# Patient Record
Sex: Male | Born: 1940 | Race: White | Hispanic: No | Smoking: Current every day smoker
Health system: Southern US, Community
[De-identification: ages and names within clinical notes are randomized; demographics above are authoritative.]

## PROBLEM LIST (undated history)

## (undated) DIAGNOSIS — R634 Abnormal weight loss: Secondary | ICD-10-CM

## (undated) DIAGNOSIS — E782 Mixed hyperlipidemia: Secondary | ICD-10-CM

## (undated) DIAGNOSIS — I10 Essential (primary) hypertension: Secondary | ICD-10-CM

## (undated) DIAGNOSIS — G587 Mononeuritis multiplex: Secondary | ICD-10-CM

## (undated) DIAGNOSIS — G609 Hereditary and idiopathic neuropathy, unspecified: Secondary | ICD-10-CM

## (undated) DIAGNOSIS — D509 Iron deficiency anemia, unspecified: Secondary | ICD-10-CM

## (undated) DIAGNOSIS — N289 Disorder of kidney and ureter, unspecified: Secondary | ICD-10-CM

## (undated) DIAGNOSIS — E119 Type 2 diabetes mellitus without complications: Secondary | ICD-10-CM

## (undated) DIAGNOSIS — Z720 Tobacco use: Secondary | ICD-10-CM

## (undated) DIAGNOSIS — R944 Abnormal results of kidney function studies: Secondary | ICD-10-CM

## (undated) HISTORY — DX: Iron deficiency anemia, unspecified: D50.9

## (undated) HISTORY — DX: Mixed hyperlipidemia: E78.2

## (undated) HISTORY — DX: Disorder of kidney and ureter, unspecified: N28.9

## (undated) HISTORY — DX: Abnormal results of kidney function studies: R94.4

## (undated) HISTORY — DX: Tobacco use: Z72.0

## (undated) HISTORY — DX: Hypercalcemia: E83.52

## (undated) HISTORY — DX: Mononeuritis multiplex: G58.7

## (undated) HISTORY — DX: Abnormal weight loss: R63.4

## (undated) HISTORY — DX: Essential (primary) hypertension: I10

## (undated) HISTORY — DX: Type 2 diabetes mellitus without complications: E11.9

## (undated) HISTORY — DX: Hereditary and idiopathic neuropathy, unspecified: G60.9

---

## 1973-08-30 HISTORY — PX: LAPAROSCOPIC APPENDECTOMY: SUR753

## 1999-11-25 ENCOUNTER — Ambulatory Visit (HOSPITAL_BASED_OUTPATIENT_CLINIC_OR_DEPARTMENT_OTHER): Admission: RE | Admit: 1999-11-25 | Discharge: 1999-11-25 | Payer: Self-pay | Admitting: *Deleted

## 2002-09-18 ENCOUNTER — Encounter: Payer: Self-pay | Admitting: *Deleted

## 2002-09-18 ENCOUNTER — Emergency Department (HOSPITAL_COMMUNITY): Admission: EM | Admit: 2002-09-18 | Discharge: 2002-09-18 | Payer: Self-pay | Admitting: *Deleted

## 2002-10-17 ENCOUNTER — Emergency Department (HOSPITAL_COMMUNITY): Admission: EM | Admit: 2002-10-17 | Discharge: 2002-10-17 | Payer: Self-pay | Admitting: Internal Medicine

## 2002-10-17 ENCOUNTER — Encounter: Payer: Self-pay | Admitting: Internal Medicine

## 2002-11-08 ENCOUNTER — Ambulatory Visit (HOSPITAL_COMMUNITY): Admission: RE | Admit: 2002-11-08 | Discharge: 2002-11-08 | Payer: Self-pay | Admitting: Family Medicine

## 2002-11-08 ENCOUNTER — Encounter: Payer: Self-pay | Admitting: Family Medicine

## 2005-10-07 ENCOUNTER — Ambulatory Visit (HOSPITAL_COMMUNITY): Admission: RE | Admit: 2005-10-07 | Discharge: 2005-10-07 | Payer: Self-pay | Admitting: Family Medicine

## 2006-02-16 ENCOUNTER — Ambulatory Visit: Payer: Self-pay | Admitting: Internal Medicine

## 2006-02-17 ENCOUNTER — Encounter (INDEPENDENT_AMBULATORY_CARE_PROVIDER_SITE_OTHER): Payer: Self-pay | Admitting: Internal Medicine

## 2006-02-17 LAB — CONVERTED CEMR LAB
PSA: 1 ng/mL
TSH: 1.265 microintl units/mL

## 2006-03-04 ENCOUNTER — Ambulatory Visit: Payer: Self-pay | Admitting: Internal Medicine

## 2006-03-30 ENCOUNTER — Ambulatory Visit: Payer: Self-pay | Admitting: Internal Medicine

## 2006-06-27 ENCOUNTER — Ambulatory Visit: Payer: Self-pay | Admitting: Internal Medicine

## 2006-06-27 LAB — CONVERTED CEMR LAB: Hgb A1c MFr Bld: 6.7 %

## 2006-07-06 DIAGNOSIS — Z720 Tobacco use: Secondary | ICD-10-CM | POA: Insufficient documentation

## 2006-07-06 DIAGNOSIS — E1149 Type 2 diabetes mellitus with other diabetic neurological complication: Secondary | ICD-10-CM | POA: Insufficient documentation

## 2006-07-06 DIAGNOSIS — E785 Hyperlipidemia, unspecified: Secondary | ICD-10-CM | POA: Insufficient documentation

## 2006-07-06 DIAGNOSIS — F17200 Nicotine dependence, unspecified, uncomplicated: Secondary | ICD-10-CM | POA: Insufficient documentation

## 2006-07-29 ENCOUNTER — Ambulatory Visit: Payer: Self-pay | Admitting: Internal Medicine

## 2006-07-29 LAB — CONVERTED CEMR LAB
Cholesterol: 217 mg/dL
HDL: 33 mg/dL
LDL Cholesterol: 141 mg/dL
Microalb Creat Ratio: 176.4 mg/g
Microalb, Ur: 13.6 mg/dL
Total CHOL/HDL Ratio: 6.6
Triglycerides: 216 mg/dL
VLDL: 43 mg/dL

## 2006-09-16 ENCOUNTER — Ambulatory Visit: Payer: Self-pay | Admitting: Internal Medicine

## 2006-09-16 LAB — CONVERTED CEMR LAB
ALT: 24 units/L (ref 0–53)
AST: 24 units/L (ref 0–37)
Albumin: 4.3 g/dL (ref 3.5–5.2)
Alkaline Phosphatase: 63 units/L (ref 39–117)
BUN: 12 mg/dL (ref 6–23)
CO2: 21 meq/L (ref 19–32)
Calcium: 9.6 mg/dL (ref 8.4–10.5)
Chloride: 104 meq/L (ref 96–112)
Creatinine, Ser: 0.86 mg/dL (ref 0.40–1.50)
Glucose, Bld: 83 mg/dL (ref 70–99)
Potassium: 5.1 meq/L (ref 3.5–5.3)
Sodium: 137 meq/L (ref 135–145)
Total Bilirubin: 0.4 mg/dL (ref 0.3–1.2)
Total Protein: 7.5 g/dL (ref 6.0–8.3)

## 2006-12-15 ENCOUNTER — Encounter (INDEPENDENT_AMBULATORY_CARE_PROVIDER_SITE_OTHER): Payer: Self-pay | Admitting: Internal Medicine

## 2006-12-22 ENCOUNTER — Ambulatory Visit: Payer: Self-pay | Admitting: Internal Medicine

## 2006-12-22 DIAGNOSIS — I10 Essential (primary) hypertension: Secondary | ICD-10-CM | POA: Insufficient documentation

## 2006-12-22 LAB — CONVERTED CEMR LAB
Blood Glucose, Fingerstick: 117
Hgb A1c MFr Bld: 6.1 %

## 2006-12-26 ENCOUNTER — Telehealth (INDEPENDENT_AMBULATORY_CARE_PROVIDER_SITE_OTHER): Payer: Self-pay | Admitting: Internal Medicine

## 2007-01-01 IMAGING — CR DG OS CALCIS 2+V*L*
2 series · 2 of 2 positions shown · non-contrast
Comparison: none

CLINICAL DATA: Left heel pain for two weeks.  No known injury.  Question spur.
LEFT OS CALCIS ? 2 VIEWS:

[view not recorded (1 of 2)]
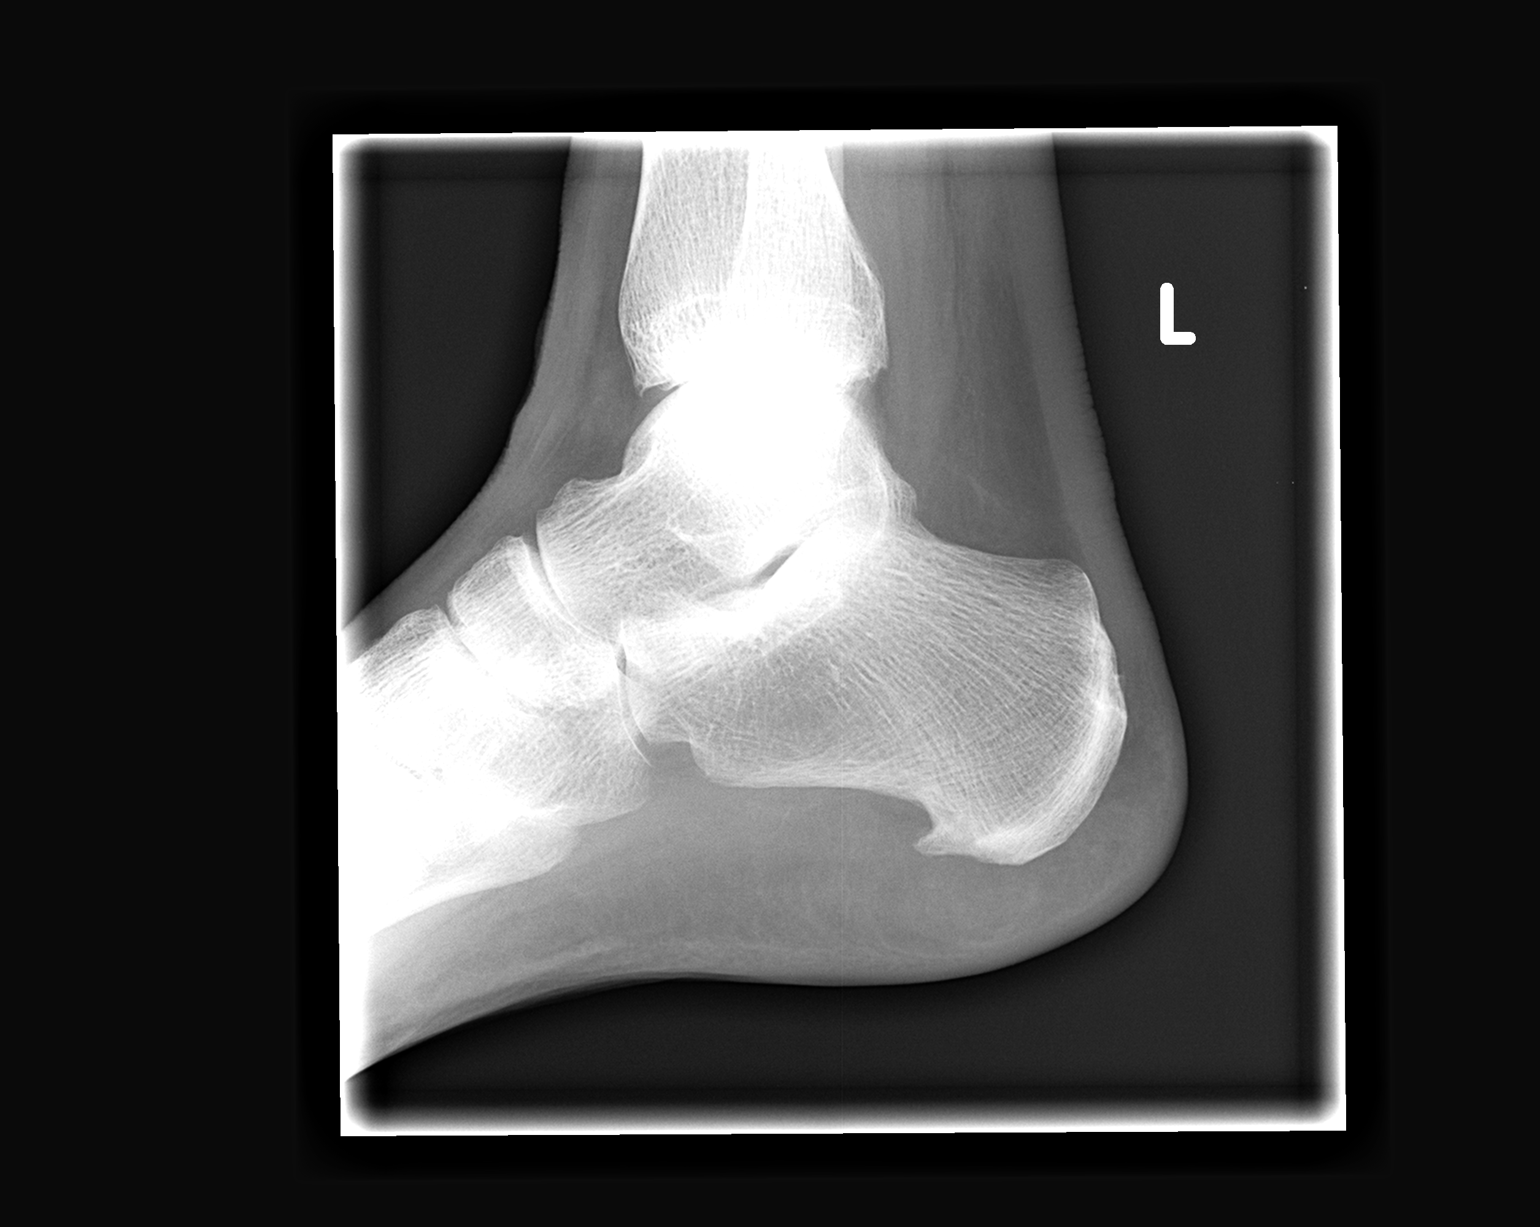

[view not recorded (2 of 2)]
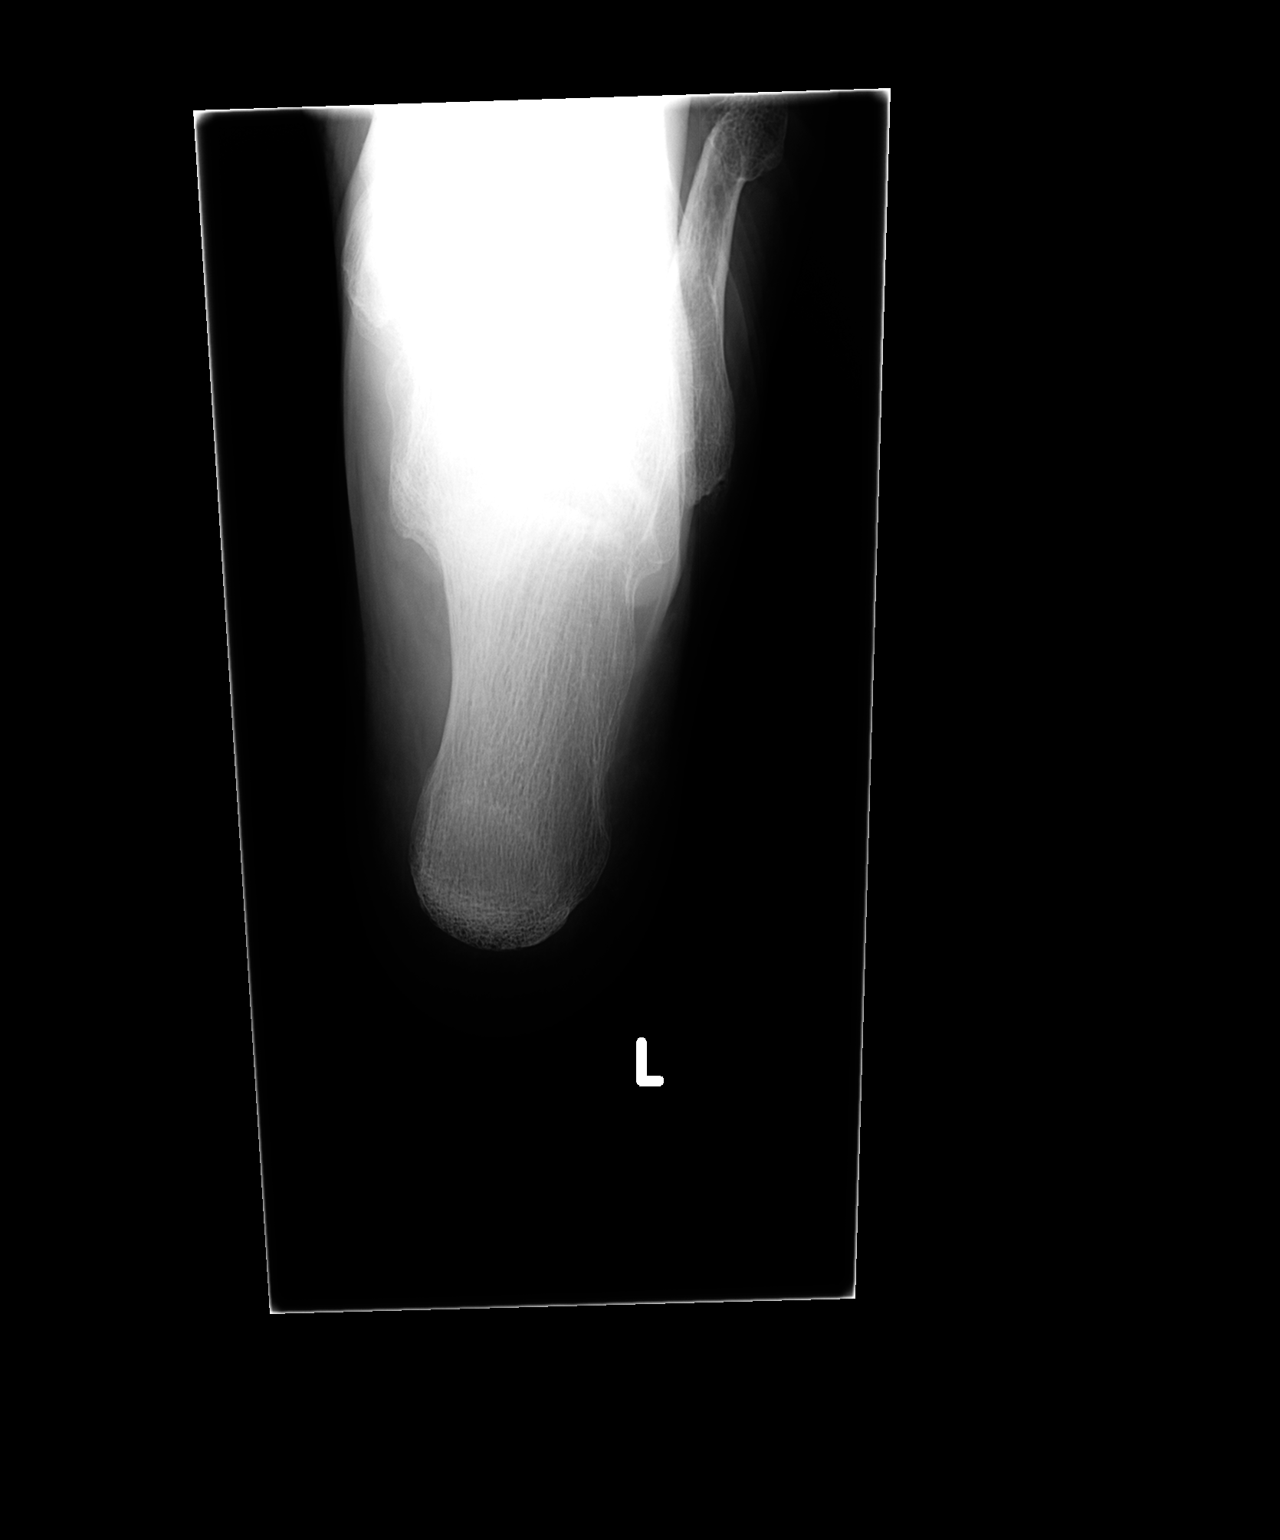

[2 of 2 positions shown; findings below may reference images not displayed]

FINDINGS: There is a moderate size spur along the plantar fascia insertion.  If further investigation of the plantar fascia is clinically desired, MRI imaging can be performed.
IMPRESSION: Moderate left-sided plantar spur.

## 2007-03-24 ENCOUNTER — Ambulatory Visit: Payer: Self-pay | Admitting: Internal Medicine

## 2007-03-24 DIAGNOSIS — R5381 Other malaise: Secondary | ICD-10-CM | POA: Insufficient documentation

## 2007-03-24 DIAGNOSIS — R5383 Other fatigue: Secondary | ICD-10-CM | POA: Insufficient documentation

## 2007-03-24 LAB — CONVERTED CEMR LAB: Hgb A1c MFr Bld: 6.2 %

## 2007-03-27 LAB — CONVERTED CEMR LAB
ALT: 11 units/L (ref 0–53)
AST: 16 units/L (ref 0–37)
Albumin: 3.9 g/dL (ref 3.5–5.2)
Alkaline Phosphatase: 60 units/L (ref 39–117)
BUN: 11 mg/dL (ref 6–23)
Basophils Absolute: 0.1 10*3/uL (ref 0.0–0.1)
Basophils Relative: 1 % (ref 0–1)
CO2: 22 meq/L (ref 19–32)
Calcium: 9.2 mg/dL (ref 8.4–10.5)
Chloride: 105 meq/L (ref 96–112)
Cholesterol: 138 mg/dL (ref 0–200)
Creatinine, Ser: 0.83 mg/dL (ref 0.40–1.50)
Eosinophils Absolute: 0.2 10*3/uL (ref 0.0–0.7)
Eosinophils Relative: 4 % (ref 0–5)
Glucose, Bld: 95 mg/dL (ref 70–99)
HCT: 39.9 % (ref 39.0–52.0)
HDL: 30 mg/dL — ABNORMAL LOW (ref >39)
Hemoglobin: 13.7 g/dL (ref 13.0–17.0)
LDL Cholesterol: 76 mg/dL (ref 0–99)
Lymphocytes Relative: 37 % (ref 12–46)
Lymphs Abs: 2.1 10*3/uL (ref 0.7–3.3)
MCHC: 34.3 g/dL (ref 30.0–36.0)
MCV: 82.1 fL (ref 78.0–100.0)
Monocytes Absolute: 0.4 10*3/uL (ref 0.2–0.7)
Monocytes Relative: 7 % (ref 3–11)
Neutro Abs: 2.8 10*3/uL (ref 1.7–7.7)
Neutrophils Relative %: 51 % (ref 43–77)
Platelets: 159 10*3/uL (ref 150–400)
Potassium: 4.6 meq/L (ref 3.5–5.3)
RBC: 4.86 M/uL (ref 4.22–5.81)
RDW: 14.4 % — ABNORMAL HIGH (ref 11.5–14.0)
Sodium: 139 meq/L (ref 135–145)
TSH: 2.974 microintl units/mL (ref 0.350–5.50)
Total Bilirubin: 0.4 mg/dL (ref 0.3–1.2)
Total CHOL/HDL Ratio: 4.6
Total Protein: 6.9 g/dL (ref 6.0–8.3)
Triglycerides: 159 mg/dL — ABNORMAL HIGH (ref <150)
VLDL: 32 mg/dL (ref 0–40)
WBC: 5.5 10*3/uL (ref 4.0–10.5)

## 2007-06-23 ENCOUNTER — Ambulatory Visit: Payer: Self-pay | Admitting: Internal Medicine

## 2007-06-23 LAB — CONVERTED CEMR LAB
Blood Glucose, Fingerstick: 132
Hgb A1c MFr Bld: 5.2 %

## 2007-09-22 ENCOUNTER — Ambulatory Visit: Payer: Self-pay | Admitting: Internal Medicine

## 2007-09-22 DIAGNOSIS — E118 Type 2 diabetes mellitus with unspecified complications: Secondary | ICD-10-CM | POA: Insufficient documentation

## 2007-09-22 LAB — CONVERTED CEMR LAB
Blood Glucose, Fingerstick: 124
Hgb A1c MFr Bld: 6.8 %

## 2007-09-25 ENCOUNTER — Telehealth (INDEPENDENT_AMBULATORY_CARE_PROVIDER_SITE_OTHER): Payer: Self-pay | Admitting: *Deleted

## 2007-09-25 LAB — CONVERTED CEMR LAB
ALT: 11 units/L (ref 0–53)
AST: 15 units/L (ref 0–37)
Albumin: 4 g/dL (ref 3.5–5.2)
Alkaline Phosphatase: 67 units/L (ref 39–117)
BUN: 13 mg/dL (ref 6–23)
CO2: 24 meq/L (ref 19–32)
Calcium: 8.9 mg/dL (ref 8.4–10.5)
Chloride: 102 meq/L (ref 96–112)
Cholesterol: 138 mg/dL (ref 0–200)
Creatinine, Ser: 0.96 mg/dL (ref 0.40–1.50)
Glucose, Bld: 109 mg/dL — ABNORMAL HIGH (ref 70–99)
HDL: 28 mg/dL — ABNORMAL LOW (ref >39)
LDL Cholesterol: 71 mg/dL (ref 0–99)
Potassium: 4.1 meq/L (ref 3.5–5.3)
Sodium: 140 meq/L (ref 135–145)
Total Bilirubin: 0.4 mg/dL (ref 0.3–1.2)
Total CHOL/HDL Ratio: 4.9
Total Protein: 6.9 g/dL (ref 6.0–8.3)
Triglycerides: 194 mg/dL — ABNORMAL HIGH (ref <150)
VLDL: 39 mg/dL (ref 0–40)

## 2007-12-22 ENCOUNTER — Ambulatory Visit: Payer: Self-pay | Admitting: Internal Medicine

## 2007-12-22 LAB — CONVERTED CEMR LAB
Blood Glucose, Fingerstick: 126
Hgb A1c MFr Bld: 6.5 %

## 2007-12-24 LAB — CONVERTED CEMR LAB
Creatinine, Urine: 251.1 mg/dL
Microalb Creat Ratio: 221 mg/g — ABNORMAL HIGH (ref 0.0–30.0)
Microalb, Ur: 55.5 mg/dL — ABNORMAL HIGH (ref 0.00–1.89)

## 2007-12-25 ENCOUNTER — Telehealth (INDEPENDENT_AMBULATORY_CARE_PROVIDER_SITE_OTHER): Payer: Self-pay | Admitting: *Deleted

## 2007-12-26 ENCOUNTER — Telehealth (INDEPENDENT_AMBULATORY_CARE_PROVIDER_SITE_OTHER): Payer: Self-pay | Admitting: Internal Medicine

## 2008-03-22 ENCOUNTER — Ambulatory Visit: Payer: Self-pay | Admitting: Internal Medicine

## 2008-03-22 LAB — CONVERTED CEMR LAB
Blood Glucose, Fingerstick: 160
Hgb A1c MFr Bld: 6.9 %

## 2008-03-25 LAB — CONVERTED CEMR LAB
ALT: 15 units/L (ref 0–53)
AST: 19 units/L (ref 0–37)
Albumin: 4 g/dL (ref 3.5–5.2)
Alkaline Phosphatase: 71 units/L (ref 39–117)
BUN: 11 mg/dL (ref 6–23)
CO2: 20 meq/L (ref 19–32)
Calcium: 9.2 mg/dL (ref 8.4–10.5)
Chloride: 106 meq/L (ref 96–112)
Cholesterol: 145 mg/dL (ref 0–200)
Creatinine, Ser: 0.98 mg/dL (ref 0.40–1.50)
Glucose, Bld: 136 mg/dL — ABNORMAL HIGH (ref 70–99)
HDL: 28 mg/dL — ABNORMAL LOW (ref >39)
LDL Cholesterol: 75 mg/dL (ref 0–99)
Potassium: 4.2 meq/L (ref 3.5–5.3)
Sodium: 139 meq/L (ref 135–145)
Total Bilirubin: 0.4 mg/dL (ref 0.3–1.2)
Total CHOL/HDL Ratio: 5.2
Total Protein: 7.2 g/dL (ref 6.0–8.3)
Triglycerides: 208 mg/dL — ABNORMAL HIGH (ref <150)
VLDL: 42 mg/dL — ABNORMAL HIGH (ref 0–40)

## 2008-03-26 ENCOUNTER — Encounter (INDEPENDENT_AMBULATORY_CARE_PROVIDER_SITE_OTHER): Payer: Self-pay | Admitting: Internal Medicine

## 2008-07-08 ENCOUNTER — Ambulatory Visit: Payer: Self-pay | Admitting: Internal Medicine

## 2008-07-08 LAB — CONVERTED CEMR LAB
Blood Glucose, Fingerstick: 128
Hgb A1c MFr Bld: 6.7 %

## 2008-07-15 ENCOUNTER — Encounter (INDEPENDENT_AMBULATORY_CARE_PROVIDER_SITE_OTHER): Payer: Self-pay | Admitting: Internal Medicine

## 2008-10-07 ENCOUNTER — Ambulatory Visit: Payer: Self-pay | Admitting: Internal Medicine

## 2008-10-07 LAB — CONVERTED CEMR LAB
Blood Glucose, Fingerstick: 117
Hgb A1c MFr Bld: 6.9 %

## 2008-10-08 LAB — CONVERTED CEMR LAB
ALT: 13 units/L (ref 0–53)
AST: 16 units/L (ref 0–37)
Albumin: 4.2 g/dL (ref 3.5–5.2)
Alkaline Phosphatase: 68 units/L (ref 39–117)
BUN: 13 mg/dL (ref 6–23)
Basophils Absolute: 0.1 10*3/uL (ref 0.0–0.1)
Basophils Relative: 1 % (ref 0–1)
CO2: 23 meq/L (ref 19–32)
Calcium: 9.6 mg/dL (ref 8.4–10.5)
Chloride: 105 meq/L (ref 96–112)
Cholesterol: 158 mg/dL (ref 0–200)
Creatinine, Ser: 1.07 mg/dL (ref 0.40–1.50)
Eosinophils Absolute: 0.2 10*3/uL (ref 0.0–0.7)
Eosinophils Relative: 3 % (ref 0–5)
Glucose, Bld: 97 mg/dL (ref 70–99)
HCT: 41.8 % (ref 39.0–52.0)
HDL: 31 mg/dL — ABNORMAL LOW (ref >39)
Hemoglobin: 13.9 g/dL (ref 13.0–17.0)
LDL Cholesterol: 80 mg/dL (ref 0–99)
Lymphocytes Relative: 38 % (ref 12–46)
Lymphs Abs: 2.6 10*3/uL (ref 0.7–4.0)
MCHC: 33.3 g/dL (ref 30.0–36.0)
MCV: 82.4 fL (ref 78.0–100.0)
Monocytes Absolute: 0.6 10*3/uL (ref 0.1–1.0)
Monocytes Relative: 9 % (ref 3–12)
Neutro Abs: 3.3 10*3/uL (ref 1.7–7.7)
Neutrophils Relative %: 49 % (ref 43–77)
Platelets: 180 10*3/uL (ref 150–400)
Potassium: 4.4 meq/L (ref 3.5–5.3)
RBC: 5.07 M/uL (ref 4.22–5.81)
RDW: 14.4 % (ref 11.5–15.5)
Sodium: 140 meq/L (ref 135–145)
Total Bilirubin: 0.4 mg/dL (ref 0.3–1.2)
Total CHOL/HDL Ratio: 5.1
Total Protein: 7.4 g/dL (ref 6.0–8.3)
Triglycerides: 235 mg/dL — ABNORMAL HIGH (ref <150)
VLDL: 47 mg/dL — ABNORMAL HIGH (ref 0–40)
WBC: 6.9 10*3/uL (ref 4.0–10.5)

## 2008-10-10 ENCOUNTER — Telehealth (INDEPENDENT_AMBULATORY_CARE_PROVIDER_SITE_OTHER): Payer: Self-pay | Admitting: *Deleted

## 2009-01-06 ENCOUNTER — Ambulatory Visit: Payer: Self-pay | Admitting: Internal Medicine

## 2009-01-06 DIAGNOSIS — B351 Tinea unguium: Secondary | ICD-10-CM | POA: Insufficient documentation

## 2009-01-06 DIAGNOSIS — T148XXA Other injury of unspecified body region, initial encounter: Secondary | ICD-10-CM | POA: Insufficient documentation

## 2009-01-06 LAB — CONVERTED CEMR LAB
Blood Glucose, Fingerstick: 145
Hgb A1c MFr Bld: 7.2 %

## 2009-01-07 ENCOUNTER — Encounter (INDEPENDENT_AMBULATORY_CARE_PROVIDER_SITE_OTHER): Payer: Self-pay | Admitting: Internal Medicine

## 2009-01-07 LAB — CONVERTED CEMR LAB
Creatinine, Urine: 220.5 mg/dL
Microalb Creat Ratio: 448.9 mg/g — ABNORMAL HIGH (ref 0.0–30.0)
Microalb, Ur: 98.99 mg/dL — ABNORMAL HIGH (ref 0.00–1.89)

## 2009-01-28 ENCOUNTER — Telehealth (INDEPENDENT_AMBULATORY_CARE_PROVIDER_SITE_OTHER): Payer: Self-pay | Admitting: Internal Medicine

## 2009-02-18 ENCOUNTER — Ambulatory Visit: Payer: Self-pay | Admitting: Internal Medicine

## 2009-02-18 LAB — CONVERTED CEMR LAB: Hemoglobin: 12 g/dL

## 2009-02-19 ENCOUNTER — Encounter (INDEPENDENT_AMBULATORY_CARE_PROVIDER_SITE_OTHER): Payer: Self-pay | Admitting: Internal Medicine

## 2009-02-19 LAB — CONVERTED CEMR LAB
BUN: 23 mg/dL (ref 6–23)
Basophils Absolute: 0.1 10*3/uL (ref 0.0–0.1)
Basophils Relative: 1 % (ref 0–1)
CO2: 19 meq/L (ref 19–32)
Calcium: 9.1 mg/dL (ref 8.4–10.5)
Chloride: 109 meq/L (ref 96–112)
Creatinine, Ser: 1.04 mg/dL (ref 0.40–1.50)
Eosinophils Absolute: 0.3 10*3/uL (ref 0.0–0.7)
Eosinophils Relative: 4 % (ref 0–5)
Glucose, Bld: 105 mg/dL — ABNORMAL HIGH (ref 70–99)
HCT: 38.8 % — ABNORMAL LOW (ref 39.0–52.0)
Hemoglobin: 13.2 g/dL (ref 13.0–17.0)
Lymphocytes Relative: 35 % (ref 12–46)
Lymphs Abs: 2.4 10*3/uL (ref 0.7–4.0)
MCHC: 34 g/dL (ref 30.0–36.0)
MCV: 81.5 fL (ref 78.0–100.0)
Monocytes Absolute: 0.5 10*3/uL (ref 0.1–1.0)
Monocytes Relative: 7 % (ref 3–12)
Neutro Abs: 3.7 10*3/uL (ref 1.7–7.7)
Neutrophils Relative %: 54 % (ref 43–77)
Platelets: 179 10*3/uL (ref 150–400)
Potassium: 4.9 meq/L (ref 3.5–5.3)
RBC: 4.76 M/uL (ref 4.22–5.81)
RDW: 14.7 % (ref 11.5–15.5)
Sodium: 140 meq/L (ref 135–145)
TSH: 2.146 microintl units/mL (ref 0.350–4.500)
WBC: 6.9 10*3/uL (ref 4.0–10.5)

## 2009-04-09 ENCOUNTER — Ambulatory Visit: Payer: Self-pay | Admitting: Internal Medicine

## 2009-04-09 LAB — CONVERTED CEMR LAB: Hgb A1c MFr Bld: 7 %

## 2009-04-10 ENCOUNTER — Encounter (INDEPENDENT_AMBULATORY_CARE_PROVIDER_SITE_OTHER): Payer: Self-pay | Admitting: Internal Medicine

## 2009-04-10 LAB — CONVERTED CEMR LAB
ALT: 15 units/L (ref 0–53)
AST: 15 units/L (ref 0–37)
Albumin: 4.3 g/dL (ref 3.5–5.2)
Alkaline Phosphatase: 69 units/L (ref 39–117)
BUN: 14 mg/dL (ref 6–23)
CO2: 20 meq/L (ref 19–32)
Calcium: 9.7 mg/dL (ref 8.4–10.5)
Chloride: 108 meq/L (ref 96–112)
Cholesterol: 179 mg/dL (ref 0–200)
Creatinine, Ser: 1 mg/dL (ref 0.40–1.50)
Glucose, Bld: 112 mg/dL — ABNORMAL HIGH (ref 70–99)
HDL: 29 mg/dL — ABNORMAL LOW (ref >39)
LDL Cholesterol: 87 mg/dL (ref 0–99)
Potassium: 4.5 meq/L (ref 3.5–5.3)
Sodium: 139 meq/L (ref 135–145)
Total Bilirubin: 0.4 mg/dL (ref 0.3–1.2)
Total CHOL/HDL Ratio: 6.2
Total Protein: 7.5 g/dL (ref 6.0–8.3)
Triglycerides: 313 mg/dL — ABNORMAL HIGH (ref <150)
VLDL: 63 mg/dL — ABNORMAL HIGH (ref 0–40)

## 2009-04-11 LAB — CONVERTED CEMR LAB
ALT: 15 units/L (ref 0–53)
AST: 18 units/L (ref 0–37)
Albumin: 4.3 g/dL (ref 3.5–5.2)
Alkaline Phosphatase: 73 units/L (ref 39–117)
Bilirubin, Direct: 0.1 mg/dL (ref 0.0–0.3)
Indirect Bilirubin: 0.3 mg/dL (ref 0.0–0.9)
Total Bilirubin: 0.4 mg/dL (ref 0.3–1.2)
Total Protein: 7.8 g/dL (ref 6.0–8.3)

## 2009-11-11 ENCOUNTER — Encounter: Payer: Self-pay | Admitting: Gastroenterology

## 2010-09-29 NOTE — Progress Notes (Signed)
Summary: phone note from pt wife about lisinapril med  Phone Note Call from Patient   Reason for Call: Talk to Nurse Summary of Call: patient's wife called and wanted to speak with Dr. Jen Mow about her husband's medication. I advised her that Dr. Jen Mow was not in this afternoon and that she could speak with a nurse and she said I am not speaking to him, he made accuzations that my husband cused at him and was taking the wrong medication.I then ask if she would like to speak with office manager and she states no, my husband already wants me to find him a new primary care dr. I then just took the note: Patient's wife states that patient is taking Lisinapril 20mg  with the Rx date of 05.06.08. He had switched his orginial Rx bottle for an easy open one and it had a diffrent dosage on the bottle then what was really in it. He halfed a 20mg  pill last night to take along with his 20mg  dosage and will start taking 40mg  tomorrow. Initial call taken by: Donneta Romberg,  December 26, 2007 5:29 PM  Follow-up for Phone Call        Noted. Follow-up by: Erle Crocker MD,  December 27, 2007 7:59 AM

## 2010-09-29 NOTE — Letter (Signed)
Summary: diabetic foot exam  diabetic foot exam   Imported By: Donneta Romberg 06/29/2007 11:45:07  _____________________________________________________________________  External Attachment:    Type:   Image     Comment:   External Document

## 2010-09-29 NOTE — Assessment & Plan Note (Signed)
Summary: DM/HTN/tobacco abuse   Vital Signs:  Patient Profile:   70 Years Old Male O2 Sat:      98 % Pulse rate:   68 / minute Resp:     8 per minute BP sitting:   154 / 82  (right arm)  Pt. in pain?   no  Vitals Entered By: Lutricia Horsfall (December 22, 2006 10:47 AM) Oxygen therapy Room Air              CBG Result 117  Prescriptions: LISINOPRIL 20 MG TABS (LISINOPRIL) 1 by mouth once daily  #90 x 3   Entered and Authorized by:   Erle Crocker MD   Signed by:   Erle Crocker MD on 12/22/2006   Method used:   Print then Give to Patient   RxID:   [id number] PRAVASTATIN SODIUM 40 MG TABS (PRAVASTATIN SODIUM) 1 by mouth once daily  #90 x 3   Entered and Authorized by:   Erle Crocker MD   Signed by:   Erle Crocker MD on 12/22/2006   Method used:   Print then Give to Patient   RxID:   [id number] METFORMIN HCL 1000 MG TABS (METFORMIN HCL) 1 by mouth two times a day  #180 x 3   Entered and Authorized by:   Erle Crocker MD   Signed by:   Erle Crocker MD on 12/22/2006   Method used:   Print then Give to Patient   RxID:   [id number]    Chief Complaint:  follow DM.  History of Present Illness: Eric Ochoa comes in today in routine diabetic follow-up.  He is not checking his fingersticks regularly.  He does no home blood pressure monitoring.  he is doing daily foot care and medications that he has a numb sensation in his toes at night, but it does not keep him up at night.  He has had no additional sores on his feet.  He is still smoking.  He says some days he tries to cut back.  He has not completely gone into the idea that he needs to stop smoking.  Current Allergies: No known allergies   Past Medical History:    Reviewed history from 12/15/2006 and no changes required:       Hyperlipidemia       NIDDM, controlled, multiple complications       peripheral neuropathy       tobacco abuse       microalbuminuria       hypertension       diabetic foot  ulcer, right, hx  Past Surgical History:    Reviewed history from 12/15/2006 and no changes required:       Appendectomy-15 years ago   Family History:    Reviewed history from 12/15/2006 and no changes required:       father- deceased-87-CVA       mother-deceased-87-DM, CVA       sister x1       brother x2       daughter-32  Social History:    Reviewed history from 12/15/2006 and no changes required:       Married lives with spouse       Current Smoker-1.5ppd x35 years       Alcohol use-no       Drug use-no     Physical Exam  General:     alert, well-developed, and well-nourished.    Diabetes Management Exam:    Foot Exam (with socks  and/or shoes not present):       Sensory-Monofilament:          Left foot: absent          Right foot: absent       Sensory-other: done 02/16/06 see scanned sheet       Inspection:          Left foot: normal          Right foot: normal    Eye Exam:       Eye Exam done elsewhere          Date: 06/01/2006          Results: normal          Done by: [facility]    Impression & Recommendations:  Problem # 1:  DIABETES MELLITUS, TYPE II, CONTROLLED, W/NEURO COMPS (ICD-250.60) Diabetes control looks good and no changes will be made in medications at this time.  Hemoglobin A1c will be rechecked in 3 months  His updated medication list for this problem includes:    Aspirin 81 Mg Tbec (Aspirin) .Marland Kitchen... 1 by mouth once daily    Metformin Hcl 1000 Mg Tabs (Metformin hcl) .Marland Kitchen... 1 by mouth two times a day    Lisinopril 20 Mg Tabs (Lisinopril) .Marland Kitchen... 1 by mouth once daily  Orders: Hemoglobin A1C (83036)   Problem # 2:  HYPERTENSION (ICD-401.9) is blood pressure remains elevated at air going to increase his lisinoprill from 10 to 20 mg daily.  I told him he should be able to get his blood pressures checked at his pharmacy, and he is going to document these until his next appointment. His updated medication list for this problem includes:     Lisinopril 20 Mg Tabs (Lisinopril) .Marland Kitchen... 1 by mouth once daily   Problem # 3:  TOBACCO ABUSE (ICD-305.1) I discussed with him the need to stop smoking.  He expresses little interest in this area.  He has gotten written information in the past and is not interested in any additional at this time.  Medications Added to Medication List This Visit: 1)  Metformin Hcl 1000 Mg Tabs (Metformin hcl) .Marland Kitchen.. 1 by mouth two times a day 2)  Lisinopril 20 Mg Tabs (Lisinopril) .Marland Kitchen.. 1 by mouth once daily  Other Orders: Capillary Blood Glucose (98119)   Patient Instructions: 1)  Please schedule a follow-up appointment in 3 months.  he will be due for CMP lipids, hemoglobin A1c, and foot exam at his next appointment.  Laboratory Results   Blood Tests    Date/Time Reported: December 22, 2006 10:50 AM   HGBA1C: 6.1%   (Normal Range: Non-Diabetic - 3-6%   Control Diabetic - 6-8%) CBG Random: 117

## 2010-09-29 NOTE — Progress Notes (Signed)
Summary: lisinopril  Phone Note Call from Patient   Summary of Call: patient called in and needs his lisinopril refilled, he tried calling his pharmacy but he is at work and he needed the rx number off the bottle and he doesn't have that.  please advise Initial call taken by: Curtis Sites,  January 28, 2009 10:20 AM      Prescriptions: LISINOPRIL 40 MG TABS (LISINOPRIL) 1 by mouth two times a day  #180 x 3   Entered by:   Micah Flesher, LPN   Authorized by:   Erle Crocker MD   Signed by:   Micah Flesher, LPN on 16/05/9603   Method used:   Electronically to        [facility]. [phone]* (retail)       [address]       [place], [place]  [address]       Ph: [phone] or [phone]       Fax: [phone]   RxID:   [id number]

## 2010-09-29 NOTE — Letter (Signed)
Summary: Vital Signs Flowsheet  Vital Signs Flowsheet   Imported By: Lutricia Horsfall LPN 16/05/9603 54:09:81  _____________________________________________________________________  External Attachment:    Type:   Image     Comment:   External Document

## 2010-09-29 NOTE — Assessment & Plan Note (Signed)
Summary: DM/lipids/HTN   Vital Signs:  Patient Profile:   70 Years Old Male O2 Sat:      100 % O2 treatment:    Room Air Pulse rate:   77 / minute Resp:     8 per minute BP sitting:   132 / 86  (left arm)  Vitals Entered By: Lutricia Horsfall (September 22, 2007 3:59 PM)             CBG Result 124     Chief Complaint:  followup DM and HTN.  History of Present Illness: Here for routine follow up.  Home fingersticks running 89-121 since decreasing his metformin.  He did not get his flu shot at work.  He says he doesn't normally get a flu shot.  He checks his blood pressure about 1 time per week and writes it down but didn't bring the results.    His left foot continues to hurt and burn when going to bed.  The gabapentin has helped with the numbness in his legs but little with the foot pain.  He is tolerating the gabapentin without trouble.  He is otherwise feeling well.   Current Allergies: No known allergies   Past Medical History:    Reviewed history from 12/15/2006 and no changes required:       Hyperlipidemia       NIDDM, controlled, multiple complications       peripheral neuropathy       tobacco abuse       microalbuminuria       hypertension       diabetic foot ulcer, right, hx  Past Surgical History:    Reviewed history from 12/15/2006 and no changes required:       Appendectomy-15 years ago   Family History:    Reviewed history from 12/15/2006 and no changes required:       father- deceased-87-CVA       mother-deceased-87-DM, CVA       sister x1       brother x2       daughter-32  Social History:    Reviewed history from 06/23/2007 and no changes required:       Married lives with spouse       Current Smoker-1.5ppd x35 years, now 1ppd.       Alcohol use-no       Drug use-no       Works making beer cans--works at [employer]    Review of Systems      See HPI   Physical Exam  General:     alert, well-developed, and well-nourished.      Impression  & Recommendations:  Problem # 1:  DIABETES MELLITUS, TYPE II, CONTROLLED, WITH COMPLICATIONS (ICD-250.90) Diabetes control looks good and no changes will be made in medications at this time.  Hemoglobin A1c will be rechecked in 3 months.  Labs were drawn today for a BMP, liver enzymes and lipid panel.  He already has his eye exam scheduled next month.   We are going to increase the gabapentin to 600mg  2 at bedtime.  He is agreeable to his flu shot today.    His updated medication list for this problem includes:    Aspirin 81 Mg Tbec (Aspirin) .Marland Kitchen... 1 by mouth once daily    Metformin Hcl 1000 Mg Tabs (Metformin hcl) .Marland Kitchen... 1/2 by mouth in am and 1/2 by mouth in pm    Lisinopril 20 Mg Tabs (Lisinopril) .Marland Kitchen... 1 by mouth once  daily  Orders: T-Comprehensive Metabolic Panel ([id number]) Capillary Blood Glucose (82948) Hemoglobin A1C (83036)   Problem # 2:  HYPERLIPIDEMIA (ICD-272.4) Lipids and liver enzymes are drawn today and adjustments will be made in medications based on the lab results.  His updated medication list for this problem includes:    Pravastatin Sodium 40 Mg Tabs (Pravastatin sodium) .Marland Kitchen... 1 by mouth once daily  Orders: T-Comprehensive Metabolic Panel [id number]) T-Lipid Profile ([address]562-13086)   Problem # 3:  HYPERTENSION (ICD-401.9) He is going to continue monitoring his blood pressures and put the documentation with his finger sticks.   His updated medication list for this problem includes:    Lisinopril 20 Mg Tabs (Lisinopril) .Marland Kitchen... 1 by mouth once daily   Complete Medication List: 1)  Aspirin 81 Mg Tbec (Aspirin) .Marland Kitchen.. 1 by mouth once daily 2)  Metformin Hcl 1000 Mg Tabs (Metformin hcl) .... 1/2 by mouth in am and 1/2 by mouth in pm 3)  Lisinopril 20 Mg Tabs (Lisinopril) .Marland Kitchen.. 1 by mouth once daily 4)  Pravastatin Sodium 40 Mg Tabs (Pravastatin sodium) .Marland Kitchen.. 1 by mouth once daily 5)  Gabapentin 600 Mg Tabs (Gabapentin) .... 2 by mouth at bedtime 6)  Freestyle  Freedom Glucometer Strips  .... Use as directed once daily as needed  Other Orders: Influenza Vaccine NON MCR (57846)   Patient Instructions: 1)  The patient will be called with the results when they are available. 2)  Please schedule a follow-up appointment in 3 months.    Prescriptions: FREESTYLE FREEDOM GLUCOMETER STRIPS use as directed once daily as needed  #100 x 3   Entered and Authorized by:   Erle Crocker MD   Signed by:   Erle Crocker MD on 09/22/2007   Method used:   Print then Give to Patient   RxID:   [id number] GABAPENTIN 600 MG  TABS (GABAPENTIN) 2 by mouth at bedtime  #180 x 3   Entered and Authorized by:   Erle Crocker MD   Signed by:   Erle Crocker MD on 09/22/2007   Method used:   Print then Give to Patient   RxID:   [id number] PRAVASTATIN SODIUM 40 MG TABS (PRAVASTATIN SODIUM) 1 by mouth once daily  #90 x 3   Entered and Authorized by:   Erle Crocker MD   Signed by:   Erle Crocker MD on 09/22/2007   Method used:   Print then Give to Patient   RxID:   [id number] LISINOPRIL 20 MG TABS (LISINOPRIL) 1 by mouth once daily  #90 x 3   Entered and Authorized by:   Erle Crocker MD   Signed by:   Erle Crocker MD on 09/22/2007   Method used:   Print then Give to Patient   RxID:   [id number] METFORMIN HCL 1000 MG TABS (METFORMIN HCL) 1/2 by mouth in am and 1/2 by mouth in pm  #90 x 3   Entered and Authorized by:   Erle Crocker MD   Signed by:   Erle Crocker MD on 09/22/2007   Method used:   Print then Give to Patient   RxID:   [id number]  ] Laboratory Results   Blood Tests   Date/Time Recieved: September 22, 2007 4:00 PM   HGBA1C: 6.8%   (Normal Range: Non-Diabetic - 3-6%   Control Diabetic - 6-8%) CBG Random: 124       Influenza Vaccine    Vaccine Type: Fluvax Non-MCR    Site: right deltoid    Mfr: novartis  Dose: 0.5 ml    Route: IM    Given by: Lutricia Horsfall    Exp. Date: 5/09    Lot #:  [id number]    VIS given: 03/23/07 version given September 22, 2007.  Flu Vaccine Consent Questions    Do you have a history of severe allergic reactions to this vaccine? no    Any prior history of allergic reactions to egg and/or gelatin? no    Do you have a sensitivity to the preservative Thimersol? no    Do you have a past history of Guillan-Barre Syndrome? no    Do you currently have an acute febrile illness? no    Have you ever had a severe reaction to latex? no    Vaccine information given and explained to patient? yes

## 2010-09-29 NOTE — Miscellaneous (Signed)
Summary: sugar readings at home  sugar readings at home   Imported By: Curtis Sites 01/08/2009 10:46:57  _____________________________________________________________________  External Attachment:    Type:   Image     Comment:   External Document

## 2010-09-29 NOTE — Assessment & Plan Note (Signed)
Summary: DM   Vital Signs:  Patient Profile:   70 Years Old Male O2 Sat:      96 % O2 treatment:    Room Air Pulse rate:   93 / minute Resp:     9 per minute BP sitting:   140 / 82  (left arm)  Vitals Entered By: Lutricia Horsfall (December 22, 2007 2:52 PM)             CBG Result 126     Chief Complaint:  followup DM.  History of Present Illness: Here for routine follow up.  His feet are still causing him some trouble.  He is taking the gabapentin but most nights he has the feeling of numbness and "ice cold" and maybe 2 nights per week it is tolerable.  He says he was rushing out of the house today and forgot his fingerstick results.  He thinks his blood pressure is up a little bit because of mowing the lawn and rushing today.    Current Allergies: No known allergies       Physical Exam  General:     alert, well-developed, and well-nourished.      Impression & Recommendations:  Problem # 1:  DIABETES MELLITUS, TYPE II, CONTROLLED, WITH COMPLICATIONS (ICD-250.90) Diabetes control looks good and no changes will be made in medications at this time.  Hemoglobin A1c will be rechecked in 3 months Urine was obtained today for a urinary microalbumin-creatinine ratio.  We are going to try Lyrica 150mg  at bedtime for the peripheral neuropathy and I have given him samples to last for 2 weeks.    His updated medication list for this problem includes:    Aspirin 81 Mg Tbec (Aspirin) .Marland Kitchen... 1 by mouth once daily    Metformin Hcl 1000 Mg Tabs (Metformin hcl) .Marland Kitchen... 1/2 by mouth in am and 1/2 by mouth in pm    Lisinopril 20 Mg Tabs (Lisinopril) .Marland Kitchen... 1 by mouth once daily  Orders: T-Urine Microalbumin w/creat. ratio (14782 / [id number]) Capillary Blood Glucose (82948) Hemoglobin A1C (83036) Fingerstick (57846)   Complete Medication List: 1)  Aspirin 81 Mg Tbec (Aspirin) .Marland Kitchen.. 1 by mouth once daily 2)  Metformin Hcl 1000 Mg Tabs (Metformin hcl) .... 1/2 by mouth in am and 1/2  by mouth in pm 3)  Lisinopril 20 Mg Tabs (Lisinopril) .Marland Kitchen.. 1 by mouth once daily 4)  Pravastatin Sodium 40 Mg Tabs (Pravastatin sodium) .Marland Kitchen.. 1 by mouth once daily 5)  Lyrica 75 Mg Caps (Pregabalin) .... 2 by mouth at bedtime 6)  Freestyle Freedom Glucometer Strips  .... Use as directed once daily as needed   Patient Instructions: 1)  I have asked him to let me know how the Lyrica is doing in 2 weeks. 2)  Please schedule a follow-up appointment in 3 months.    Prescriptions: LYRICA 75 MG  CAPS (PREGABALIN) 2 by mouth at bedtime  #28 x 0   Entered and Authorized by:   Erle Crocker MD   Signed by:   Erle Crocker MD on 12/22/2007   Method used:   Samples Given   RxID:   [id number]  ] Laboratory Results   Blood Tests   Date/Time Recieved: December 22, 2007 2:52 PM   HGBA1C: 6.5%   (Normal Range: Non-Diabetic - 3-6%   Control Diabetic - 6-8%) CBG Random: 126

## 2010-09-29 NOTE — Letter (Signed)
Summary: Pt Record of FSBS  Pt Record of FSBS   Imported By: Lutricia Horsfall LPN 09/01/7251 66:44:03  _____________________________________________________________________  External Attachment:    Type:   Image     Comment:   External Document

## 2010-09-29 NOTE — Miscellaneous (Signed)
Summary: blood sugars  blood sugars   Imported By: Curtis Sites 03/26/2008 11:25:23  _____________________________________________________________________  External Attachment:    Type:   Image     Comment:   External Document

## 2010-09-29 NOTE — Assessment & Plan Note (Signed)
Summary: DM/HTN/bruising   Vital Signs:  Patient profile:   70 year old male Weight:      171.75 pounds O2 Sat:      98 % Pulse rate:   86 / minute BP sitting:   130 / 86  (right arm)  Vitals Entered By: Micah Flesher, LPN (Jan 06, 2009 3:58 PM) CC: DM F/U Is Patient Diabetic? Yes  CBG Result 145   CC:  DM F/U.  History of Present Illness: Patient presents today for routine follow up.  He says his feet are burning and feeling numb despite using the gabapentin.  He says it is most aggrevating before he goes to sleep but he is able to fall asleep.  He thinks the gabapentin does help some.  He never got any capsaicin cream to try.  He also notes that both great toe nails break off easily and both are very short now because he intentionally broke both off.  He has been checking his sugars and they are running mostly in the 100-140 range, and he as checked sugars non fasting.  He has done a few blood pressure checks at [facility] and his BP is in the 130-150s/80-90s range.    He is also very concerned about bruising easily.  He says it is getting embarrassing how easily he bruises.  He says it didn't get any worse after we started the fish oil capsules.  He says he doesn't have to hit himself on something, but just scratch it, and his arms will bruise.  Allergies: No Known Drug Allergies  Past History:  Past Medical History:    Reviewed history from 12/15/2006 and no changes required:    Hyperlipidemia    NIDDM, controlled, multiple complications    peripheral neuropathy    tobacco abuse    microalbuminuria    hypertension    diabetic foot ulcer, right, hx  Past Surgical History:    Reviewed history from 12/15/2006 and no changes required:    Appendectomy-15 years ago  Family History:    Reviewed history from 12/15/2006 and no changes required:       father- deceased-87-CVA       mother-deceased-87-DM, CVA       sister x1       brother x2       daughter-32  Social  History:    Reviewed history from 10/07/2008 and no changes required:       Married lives with spouse       Current Smoker-1.5ppd x35 year.       Alcohol use-no       Drug use-no       Works making beer cans--works at [employer]  Review of Systems      See HPI  Physical Exam  General:  alert, well-developed, and well-nourished.   Skin:  scattered ecchymoses lower arms.  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  He says he is following his diet pretty closely so we are going to increase metformin to 1000mg  two times a day.  We are going to increase the gabapentin to 800mg  2 by mouth at bedtime and I did recommend again that he try capsaicin cream.  Discussed the importance of checking his feet each day and I clearly demonstrated to him the decreased sensation he has with a test of sharp sensation that he could not feel as sharp.  Toe nail scrapings were obtained for fungal culture.  Urine was obtained today for a urinary microalbumin-creatinine ratio.  His updated medication list for this problem includes:    Aspirin 81 Mg Tbec (Aspirin) .Marland Kitchen... 1 by mouth once daily    Metformin Hcl 1000 Mg Tabs (Metformin hcl) .Marland Kitchen... 1 by mouth two times a day    Lisinopril 40 Mg Tabs (Lisinopril) .Marland Kitchen... 1 by mouth two times a day  Orders: T-Urine Microalbumin w/creat. ratio [id number] / [id number]) T- * Misc. Laboratory test [id number]) Capillary Blood Glucose/CBG (82948) Hemoglobin A1C (72536)  Problem # 2:  HYPERTENSION (ICD-401.9) His blood pressures are not ideal  at this time based on home checks, although his BP in the office today is not bad.  As we are already going to make changes to 2 other medications, we are not going to change his blood pressure medication at this time and he is going to continue to check it.  If it remains elevated at his next appointment, we will add amlodipine. His updated medication list for this problem includes:    Lisinopril 40 Mg Tabs (Lisinopril) .Marland Kitchen... 1 by mouth two times a day  Problem # 3:  BRUISE (ICD-924.9) The bruising seems to be confined to his lower arms.  We discussed the antiplatelet effect of aspirin and fish oil.  We also discussed the benefits of both.  He currently is opting to stay on both.    Complete Medication List: 1)  Aspirin 81 Mg Tbec (Aspirin) .Marland Kitchen.. 1 by mouth once daily 2)  Metformin Hcl 1000 Mg Tabs (Metformin hcl) .Marland Kitchen.. 1 by mouth two times a day 3)  Lisinopril 40 Mg Tabs (Lisinopril) .Marland Kitchen.. 1 by mouth two times a day 4)  Pravastatin Sodium 40 Mg Tabs (Pravastatin sodium) .Marland Kitchen.. 1 by mouth once daily 5)  Gabapentin 800 Mg Tabs (Gabapentin) .... 2 by mouth at bedtime 6)  Freestyle Freedom Glucometer Strips  .... Use as directed once daily as needed 7)  Capsaicin Apr 0.075 % Crea (Capsaicin) .... Apply a thin layer to feet at bedtime 8)  Fish Oil 1000 Mg Caps (Omega-3 fatty acids) .... Take one two times a day  Patient Instructions: 1)  Please schedule a follow-up appointment in 3 months. Prescriptions: PRAVASTATIN SODIUM 40 MG TABS (PRAVASTATIN SODIUM) 1 by mouth once daily  #90 x 3   Entered and Authorized by:   Erle Crocker MD   Signed by:   Erle Crocker MD on 01/06/2009   Method used:   Electronically to        [facility]* (mail-order)       [address] [place], [place]  [address]       Ph: [phone]       Fax: [phone]   RxID:   [id number] GABAPENTIN 800 MG  TABS (GABAPENTIN) 2 by mouth at bedtime  #180 x 3   Entered and Authorized by:   Erle Crocker MD   Signed by:   Erle Crocker MD on 01/06/2009   Method used:   Electronically to        [facility]* (mail-order)       [address] [place], [place]  [address]       Ph: [phone]       Fax: [phone]   RxID:   [id number] METFORMIN HCL 1000 MG TABS (METFORMIN HCL) 1 by mouth two times a day  #180 x 3   Entered and Authorized by:   Erle Crocker MD   Signed by:   Erle Crocker MD on 01/06/2009   Method used:   Electronically to        [facility]* (mail-order)       [address] [place], [place]  [address]       Ph: [phone]       Fax: [phone]   RxID:   [id number]  `     Laboratory Results   Blood Tests   Date/Time Received: Jan 06, 2009 4:00 PM   HGBA1C: 7.2%   (Normal Range: Non-Diabetic - 3-6%   Control Diabetic - 6-8%) CBG Random:: 145mg /dL

## 2010-09-29 NOTE — Assessment & Plan Note (Signed)
Summary: DM/HTN/lipids/fatigue   Vital Signs:  Patient Profile:   70 Years Old Male O2 Sat:      97 % Pulse rate:   79 / minute Resp:     8 per minute BP sitting:   150 / 80  (left arm)  Vitals Entered By: Lutricia Horsfall (March 24, 2007 4:19 PM) Oxygen therapy Room Air               Chief Complaint:  followup DM.  History of Present Illness: [name] presents today for routine follow-up.  he is having episodes where will feel fatigued.  He did check his sugar with one of the episodes and it was 78.  He is documented 7 intermittent fingersticks over the past 3 months and they have ranged 89 to 121 before meals and137 and 141 after meals.  he does alternate shifts working first at one week, second shifts the next week and third shift the week after that.  He is actually working third shift this week and is going to work after leaving here.  We discussed the possibility of smoking cessation again and he seems a little more receptive to the idea, but doesn't know what he would do to try and quit smoking.  Current Allergies: No known allergies   Past Medical History:    Reviewed history from 12/15/2006 and no changes required:       Hyperlipidemia       NIDDM, controlled, multiple complications       peripheral neuropathy       tobacco abuse       microalbuminuria       hypertension       diabetic foot ulcer, right, hx  Past Surgical History:    Reviewed history from 12/15/2006 and no changes required:       Appendectomy-15 years ago   Family History:    Reviewed history from 12/15/2006 and no changes required:       father- deceased-87-CVA       mother-deceased-87-DM, CVA       sister x1       brother x2       daughter-32  Social History:    Reviewed history from 12/15/2006 and no changes required:       Married lives with spouse       Current Smoker-1.5ppd x35 years       Alcohol use-no       Drug use-no    Review of Systems      See HPI   Physical  Exam  General:     alert, well-developed, and well-nourished.      Impression & Recommendations:  Problem # 1:  DIABETES MELLITUS, TYPE II, CONTROLLED, W/NEURO COMPS (ICD-250.60) his hemoglobin A1c is excellent, but I wonder if his episodes of fatigue may be related to lower sugars.  His hemoglobin A1c does not need to be as low as 6.2 so we are going to back off on his metformin 1000 mg in the morning, and 500 mg in the evening.  if he is working third shift, he says sometimes he doesn't eat when he gets up and walking until about 3 or 4 in the afternoon, on those days I recommend that he take a half a pill in the morning and the whole pill later in the day when he is eating. His updated medication list for this problem includes:    Aspirin 81 Mg Tbec (Aspirin) .Marland Kitchen... 1 by mouth once  daily    Metformin Hcl 1000 Mg Tabs (Metformin hcl) .Marland Kitchen... 1 by mouth in am and 1/2 by mouth in pm    Lisinopril 20 Mg Tabs (Lisinopril) .Marland Kitchen... 1 by mouth once daily  Orders: T-Comprehensive Metabolic Panel ([id number]) Hemoglobin A1C (83036)   Problem # 2:  HYPERTENSION (ICD-401.9) his blood pressure is up a bit, but did come down even just sitting in the office.  He has been giving consideration to getting a home blood pressure monitor and I did discuss with him the benefits of having this.  Even if he does not get the blood pressure monitor I have asked him to have his blood pressure checked if he goes to [facility], [place] or a pharmacy and to document these over the next 4 to 6 weeks, and drop them off for my review. His updated medication list for this problem includes:    Lisinopril 20 Mg Tabs (Lisinopril) .Marland Kitchen... 1 by mouth once daily   Problem # 3:  .lipidHYPERLIPIDEMIA (ICD-272.4) Lipids and liver enzymes are drawn today and adjustments will be made in medications will be made based on the lab results.  His updated medication list for this problem includes:    Pravastatin Sodium 40 Mg Tabs (Pravastatin  sodium) .Marland Kitchen... 1 by mouth once daily  Orders: T-Comprehensive Metabolic Panel [id number]) T-Lipid Profile ([address]2376-28[address])   Problem # 4:  FATIGUE (ICD-780.79) as stated I wonder if his episodes of fatigue may be from lower sugars, but will also check CBC and TSH for further evaluation. Orders: T-CBC w/Diff ([id number]) T-TSH ([phone])   Medications Added to Medication List This Visit: 1)  Metformin Hcl 1000 Mg Tabs (Metformin hcl) .Marland Kitchen.. 1 by mouth in am and 1/2 by mouth in pm   Serial Vital Signs/Assessments:  Time      Position  BP       Pulse  Resp  Temp     By 1635                138/74                         Erle Crocker MD  Comments: 2703 after sitting for 10 minutes By: Erle Crocker MD    Patient Instructions: 1)  The patient will be called with the results when they are available. 2)  if his labs look good, he can follow-up in 3 months.        Laboratory Results   Blood Tests     HGBA1C: 6.2%   (Normal Range: Non-Diabetic - 3-6%   Control Diabetic - 6-8%)

## 2010-09-29 NOTE — Letter (Signed)
Summary: diabetic foot screen  diabetic foot screen   Imported By: Curtis Sites 01/08/2009 10:47:24  _____________________________________________________________________  External Attachment:    Type:   Image     Comment:   External Document

## 2010-09-29 NOTE — Progress Notes (Signed)
Summary: lisinopril  Phone Note Call from Patient   Summary of Call: [facility] [place] [facility]. thanks He had not called his pharmacy. Initial call taken by: Curtis Sites,  October 10, 2008 4:09 PM      Prescriptions: LISINOPRIL 40 MG TABS (LISINOPRIL) 1 by mouth two times a day  #180 x 3   Entered by:   Lutricia Horsfall LPN   Authorized by:   Erle Crocker MD   Signed by:   Lutricia Horsfall LPN on 16/05/9603   Method used:   [facility]        [phone]ail[facility]       [address]       [place], [place]  [address]       Ph: [phone] or [phone]       Fax: [phone]   RxID:   [id number]

## 2010-09-29 NOTE — Assessment & Plan Note (Signed)
Summary: DM/HTN/lipids   Vital Signs:  Patient Profile:   69 Years Old Male Height:     72.25 inches Weight:      170.75 pounds BMI:     23.08 O2 Sat:      98 % O2 treatment:    Room Air Pulse rate:   77 / minute Resp:     10 per minute BP sitting:   168 / 88  (left arm)  Vitals Entered By: Lutricia Horsfall LPN (October 07, 2008 3:59 PM)             CBG Result 117     Serial Vital Signs/Assessments:  Time      Position  BP       Pulse  Resp  Temp     By 4:22 PM             144/82                         Erle Crocker MD   Chief Complaint:  followup DM.  History of Present Illness: Patient presents today for routine diabetes follow up.  Pt. checks sugars 1 times per week and the sugars are running 91-128 at different times during the day.  Pt. does no routine blood pressure checks but will occasionally check it at [employer] but never writes it down and doesn't remember any numbers.       Prior Medications Reviewed Using: Patient Recall  Current Allergies: No known allergies   Past Medical History:    Reviewed history from 12/15/2006 and no changes required:       Hyperlipidemia       NIDDM, controlled, multiple complications       peripheral neuropathy       tobacco abuse       microalbuminuria       hypertension       diabetic foot ulcer, right, hx   Social History:    Married lives with spouse    Current Smoker-1.5ppd x35 year.    Alcohol use-no    Drug use-no    Works making beer cans--works at [employer]     Physical Exam  General:     alert, well-developed, and well-nourished.   Lungs:     Clear to auscultation bilaterally with good air movement, normal expansion.  Heart:     Normal S1 and S2. No murmurs or extracardiac sounds. PMI is nondisplaced.     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  Hemoglobin A1c will be rechecked in 3 months.  Labs were drawn today for a BMP, liver enzymes and lipid panel.   Reminded him that he is due for eye exam this year and he prefers to make appt himself due to his work schedule.  His updated medication list for this problem includes:    Aspirin 81 Mg Tbec (Aspirin) .Marland Kitchen... 1 by mouth once daily    Metformin Hcl 500 Mg Tabs (Metformin hcl) .Marland Kitchen... 1 by mouth two times a day    Lisinopril 40 Mg Tabs (Lisinopril) .Marland Kitchen... 1 by mouth two times a day  Orders: T-Comprehensive Metabolic Panel ([id number]) Hemoglobin A1C (83036) Capillary Blood Glucose (09811)   Problem # 2:  HYPERTENSION (ICD-401.9) I have asked him to check his blood pressure once per week for 4  weeks and drop off results for my review.  Discussed the importance of blood pressure as a vascular risk factor.  His updated medication list for this problem includes:    Lisinopril 40 Mg Tabs (Lisinopril) .Marland Kitchen... 1 by mouth two times a day   Problem # 3:  HYPERLIPIDEMIA (ICD-272.4) Lipids and liver enzymes are drawn today and adjustments will be made in medications based on the lab results.  His updated medication list for this problem includes:    Pravastatin Sodium 40 Mg Tabs (Pravastatin sodium) .Marland Kitchen... 1 by mouth once daily  Orders: T-Comprehensive Metabolic Panel [id number]) T-Lipid Profile ([id number])   Complete Medication List: 1)  Aspirin 81 Mg Tbec (Aspirin) .Marland Kitchen.. 1 by mouth once daily 2)  Metformin Hcl 500 Mg Tabs (Metformin hcl) .Marland Kitchen.. 1 by mouth two times a day 3)  Lisinopril 40 Mg Tabs (Lisinopril) .Marland Kitchen.. 1 by mouth two times a day 4)  Pravastatin Sodium 40 Mg Tabs (Pravastatin sodium) .Marland Kitchen.. 1 by mouth once daily 5)  Gabapentin 600 Mg Tabs (Gabapentin) .... 2 by mouth at bedtime 6)  Freestyle Freedom Glucometer Strips  .... Use as directed once daily as needed 7)  Capsaicin Apr 0.075 % Crea (Capsaicin) .... Apply a thin layer to feet at bedtime  Other  Orders: T-CBC w/Diff ([id number])   Patient Instructions: 1)  The patient will be called with the results when they are available.    Laboratory Results   Blood Tests   Date/Time Received: October 07, 2008 3:59 PM   HGBA1C: 6.9%   (Normal Range: Non-Diabetic - 3-6%   Control Diabetic - 6-8%) CBG Random:: 117mg /dL

## 2010-09-29 NOTE — Assessment & Plan Note (Signed)
Summary: HTN/DM/lipids   Vital Signs:  Patient profile:   70 year old male Height:      72.25 inches Weight:      168 pounds Pulse rate:   80 / minute BP sitting:   148 / 81  (left arm) Cuff size:   regular  Vitals Entered By: Carlye Grippe (April 09, 2009 3:42 PM) CC: follow-up visit DM Is Patient Diabetic? Yes    CC:  follow-up visit DM.  History of Present Illness: Patient presents today for routine follow up.  He says his fatigue is a little better.  He is still working 12 hours shifts 5-6 days per week.  He has been checking his blood pressures and it runs 130-140s/60-80's   Allergies: No Known Drug Allergies  Past History:  Past Medical History: Reviewed history from 12/15/2006 and no changes required. Hyperlipidemia NIDDM, controlled, multiple complications peripheral neuropathy tobacco abuse microalbuminuria hypertension diabetic foot ulcer, right, hx  Family History: father- deceased-87-CVA mother-deceased-87-DM, CVA sister x1--?? brother x2--Old age daughter-32-healthy  Social History: Married lives with spouse Current Smoker-1.5ppd x35 year, now 1 ppd Alcohol use-no Drug use-no Works making beer cans--works at [employer]  Review of Systems      See HPI  Physical Exam  General:  alert, well-developed, and well-nourished.     Impression & Recommendations:  Problem # 1:  HYPERTENSION (ICD-401.9) BP not at goal so will increase amlodipine to 10mg  once daily.  He is aware of the goal of <130/<80.   His updated medication list for this problem includes:    Lisinopril 40 Mg Tabs (Lisinopril) .Marland Kitchen... 1 by mouth two times a day    Amlodipine Besylate 10 Mg Tabs (Amlodipine besylate) .Marland Kitchen... 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  Labs were drawn today for a BMP, liver enzymes and lipid panel.   His updated medication list for this problem  includes:    Aspirin 81 Mg Tbec (Aspirin) .Marland Kitchen... 1 by mouth once daily    Metformin Hcl 1000 Mg Tabs (Metformin hcl) .Marland Kitchen... 1 by mouth two times a day    Lisinopril 40 Mg Tabs (Lisinopril) .Marland Kitchen... 1 by mouth two times a day  Orders: Hgb A1C (13086VH) T-Comprehensive Metabolic Panel ([id number])  Problem # 3:  HYPERLIPIDEMIA (ICD-272.4) Lipids and liver enzymes are drawn today and adjustments will be made in medications based on the lab results.  His updated medication list for this problem includes:    Pravastatin Sodium 40 Mg Tabs (Pravastatin sodium) .Marland Kitchen... 1 by mouth once daily  Orders: T-Comprehensive Metabolic Panel ([id number]) T-Lipid Profile (72536-64403)  Complete Medication List: 1)  Aspirin 81 Mg Tbec (Aspirin) .Marland Kitchen.. 1 by mouth once daily 2)  Metformin Hcl 1000 Mg Tabs (Metformin hcl) .Marland Kitchen.. 1 by mouth two times a day 3)  Lisinopril 40 Mg Tabs (Lisinopril) .Marland Kitchen.. 1 by mouth two times a day 4)  Pravastatin Sodium 40 Mg Tabs (Pravastatin sodium) .Marland Kitchen.. 1 by mouth once daily 5)  Gabapentin 800 Mg Tabs (Gabapentin) .... 2 by mouth at bedtime 6)  Freestyle Freedom Glucometer Strips  .... Use as directed once daily as needed 7)  Capsaicin Apr 0.075 % Crea (Capsaicin) .... Apply a thin layer to feet at bedtime 8)  Amlodipine Besylate 10 Mg Tabs (Amlodipine besylate) .Marland Kitchen.. 1 by mouth once daily 9)  Terbinafine Hcl 250 Mg Tabs (Terbinafine hcl) .... Take two by mouth daily  Patient Instructions: 1)  You will be called with the results when they are available. Prescriptions: AMLODIPINE BESYLATE 10 MG TABS (AMLODIPINE BESYLATE) 1 by mouth once daily  #90 x 1   Entered and Authorized by:   Erle Crocker MD   Signed by:   Erle Crocker MD on 04/09/2009   Method used:   [facility] to        [facility] [phone][facility] (retail)       [address]       [place], [place]  [address]       Ph: [phone] or [phone]       Fax: [phone]   RxID:    [id number]   Laboratory Results   Blood Tests   Date/Time Recieved: April 09, 2009 3:55 PM   Date/Time Reported: April 09, 2009 3:55 PM   HGBA1C: 7.0%   (Normal Range: Non-Diabetic - 3-6%   Control Diabetic - 6-8%)

## 2010-09-29 NOTE — Letter (Signed)
Summary: diabetes care flow sheet  diabetes care flow sheet   Imported By: Curtis Sites 01/08/2009 08:33:46  _____________________________________________________________________  External Attachment:    Type:   Image     Comment:   External Document

## 2010-09-29 NOTE — Letter (Signed)
Summary: Internal Other Domingo Dimes  Internal Other Domingo Dimes   Imported By: Cloria Spring LPN 16/05/9603 54:09:81  _____________________________________________________________________  External Attachment:    Type:   Image     Comment:   External Document

## 2010-09-29 NOTE — Letter (Signed)
Summary: diabeties care flow sheet  diabeties care flow sheet   Imported By: Curtis Sites 03/26/2008 11:21:16  _____________________________________________________________________  External Attachment:    Type:   Image     Comment:   External Document

## 2010-09-29 NOTE — Progress Notes (Signed)
Summary: lab results/med change  Phone Note Outgoing Call   Call placed by: Sonny Dandy,  December 25, 2007 9:56 AM Summary of Call: results given to wife, asked about Lisinopril and daughter said the bottle was 10mg . Do you still want to increase this to 40mg ? Initial call taken by: Sonny Dandy,  December 25, 2007 9:57 AM  Follow-up for Phone Call        Increase to 20mg  1 by mouth once daily  Follow-up by: Erle Crocker MD,  December 25, 2007 10:12 AM  Additional Follow-up for Phone Call Additional follow up Details #1::        Spoke with wife and she will check with drug company that sends his meds to see what they sent last time. .................................................................Marland KitchenMarland KitchenSonny Dandy  December 25, 2007 10:34 AM  Spoke with wife, she will have patient call us .................................................................Marland KitchenMarland KitchenSonny Dandy  December 26, 2007 2:13 PM  called patient, no answer Sonny Dandy  December 28, 2007 10:49 AM     Additional Follow-up for Phone Call Additional follow up Details #2::    Patient in to see Sherrie Follow-up by: Sonny Dandy,  Dec 29, 2007 9:09 AM

## 2010-09-29 NOTE — Letter (Signed)
Summary: Diabetes Flowsheet, Preventative Vaccines and Tests  Diabetes Flowsheet, Preventative Vaccines and Tests   Imported By: Eric Horsfall LPN 17/61/6073 71:06:26  _____________________________________________________________________  External Attachment:    Type:   Image     Comment:   External Document

## 2010-09-29 NOTE — Assessment & Plan Note (Signed)
Summary: DM/HTN/tobacco   Vital Signs:  Patient Profile:   70 Years Old Male O2 Sat:      98 % Pulse rate:   78 / minute Resp:     8 per minute BP sitting:   136 / 92  (left arm)  Vitals Entered By: Lutricia Horsfall (June 23, 2007 8:56 AM) Oxygen therapy Room Air             CBG Result 132     Chief Complaint:  followup DM.  History of Present Illness: Here for routine follow up.  Sugars at home 90-121 at different times during the day.  He is working on cutting back on smoking.  His last eye exam was in November 2007 and he is aware that he needs to reschedule for next month.  He does not think that he can give a urine specimen today.  Does occasional blood pressure check, 130/70-80.  He can get his flu shot at work next month.  Current Allergies: No known allergies   Past Medical History:    Reviewed history from 12/15/2006 and no changes required:       Hyperlipidemia       NIDDM, controlled, multiple complications       peripheral neuropathy       tobacco abuse       microalbuminuria       hypertension       diabetic foot ulcer, right, hx  Past Surgical History:    Reviewed history from 12/15/2006 and no changes required:       Appendectomy-15 years ago   Family History:    Reviewed history from 12/15/2006 and no changes required:       father- deceased-87-CVA       mother-deceased-87-DM, CVA       sister x1       brother x2       daughter-32  Social History:    Married lives with spouse    Current Smoker-1.5ppd x35 years, now 1ppd.    Alcohol use-no    Drug use-no     Review of Systems      See HPI   Physical Exam  General:     alert, well-developed, and well-nourished.    Diabetes Management Exam:    Foot Exam (with socks and/or shoes not present):       Sensory-other: see scanned sheet    Impression & Recommendations:  Problem # 1:  DIABETES MELLITUS, TYPE II, CONTROLLED, W/NEURO COMPS (ICD-250.60) Diabetes control looks good  and no changes will be made in medications at this time.  Hemoglobin A1c will be rechecked in 3 months.  He will get his eyes examined next month.  He knows we need a urine specimen next appointment.  His updated medication list for this problem includes:    Aspirin 81 Mg Tbec (Aspirin) .Marland Kitchen... 1 by mouth once daily    Metformin Hcl 1000 Mg Tabs (Metformin hcl) .Marland Kitchen... 1/2 by mouth in am and 1/2 by mouth in pm    Lisinopril 20 Mg Tabs (Lisinopril) .Marland Kitchen... 1 by mouth once daily  Orders: Capillary Blood Glucose (16109)   Problem # 2:  HYPERTENSION (ICD-401.9) His blood pressures are borderline and we need to follow this. His updated medication list for this problem includes:    Lisinopril 20 Mg Tabs (Lisinopril) .Marland Kitchen... 1 by mouth once daily   Problem # 3:  TOBACCO ABUSE (ICD-305.1) He is encouraged in his efforts to quit smoking.  Complete Medication List: 1)  Aspirin 81 Mg Tbec (Aspirin) .Marland Kitchen.. 1 by mouth once daily 2)  Metformin Hcl 1000 Mg Tabs (Metformin hcl) .... 1/2 by mouth in am and 1/2 by mouth in pm 3)  Lisinopril 20 Mg Tabs (Lisinopril) .Marland Kitchen.. 1 by mouth once daily 4)  Pravastatin Sodium 40 Mg Tabs (Pravastatin sodium) .Marland Kitchen.. 1 by mouth once daily  Other Orders: Hemoglobin A1C (54098)   Patient Instructions: 1)  Please schedule a follow-up appointment in 3 months.    ] Laboratory Results   Blood Tests   Date/Time Recieved: June 23, 2007 8:56 AM  Date/Time Reported: June 23, 2007 8:56 AM   HGBA1C: 5.2%   (Normal Range: Non-Diabetic - 3-6%   Control Diabetic - 6-8%) CBG Random: 132      Appended Document: Orders Update    Clinical Lists Changes Mr. Lyerly does note that his feet have been bothering him at night with numbness.  He is agreeable trying neurontin at bedtime.  Prescription left at front desk for patient to pick up.  He left the office today before I remembered to give him the prescription.    Prescriptions: GABAPENTIN 600 MG  TABS (GABAPENTIN) 1/2  by mouth at bedtime for 1 week then 1 by mouth at bedtime  #90 x 3   Entered and Authorized by:   Erle Crocker MD   Signed by:   Erle Crocker MD on 06/23/2007   Method used:   Print then Give to Patient   RxID:   [id number]

## 2010-09-29 NOTE — Progress Notes (Signed)
Summary: METFORMIN RX  Phone Note Call from Patient   Caller: Patient Call For: AL Summary of Call: PATIENT OF DR METZ STATES THAT HE IS CONCERNED ABOUT HIS METFORMIN RX.  HAS BEEN TAKING 1000MG  TWICE PER DAY.  IS THIS OK? PLEASE CALL. [phone] Initial call taken by: Magdalene River,  December 26, 2006 3:12 PM  Follow-up for Phone Call        Advised patient, per his records he is to take 1000mg  two times a day. patient voices understanding Follow-up by: Sonny Dandy,  December 26, 2006 4:24 PM  Additional Follow-up for Phone Call Additional follow up Details #1::        agree Additional Follow-up by: Erle Crocker MD,  December 26, 2006 4:40 PM

## 2010-09-29 NOTE — Assessment & Plan Note (Signed)
Summary: fatigue/HTN/triglycerides/colon cancer screening    Vital Signs:  Patient profile:   70 year old male Weight:      170.25 pounds O2 Sat:      98 % on Room air Pulse rate:   88 / minute BP sitting:   130 / 68  (right arm)  Vitals Entered By: Micah Flesher, LPN (February 18, 2009 10:53 AM) CC: F/U DM   CC:  F/U DM.  History of Present Illness: Patient presents today for routine follow up.  He has been noticing increased fatigue for the past 2 weeks.  He says "I just have to get my energy up."  He says some days he will get up and feel good and spend the whole day in the yard, and some days he just has to work to get his energy level up.  Since starting amlodipine 5mg  once daily his blood pressures at home have run 102-140s/60-70s.  His sugars have been in the 94-150 range in the morning and 98-169 before supper.    He says he tried the fish oil capsules but it gave him diarrhea.  He stopped it 5 days ago and the diarrhea has resolved.  He is concerned about what can be done about the triglycerides.  He says colon cancer screening has been discussed with him in the past and he hasn't wanted it, although he might consider a colonoscopy soon.    Allergies: No Known Drug Allergies  Past History:  Past Medical History: Reviewed history from 12/15/2006 and no changes required. Hyperlipidemia NIDDM, controlled, multiple complications peripheral neuropathy tobacco abuse microalbuminuria hypertension diabetic foot ulcer, right, hx  Past Surgical History: Reviewed history from 12/15/2006 and no changes required. Appendectomy-15 years ago  Family History: Reviewed history from 12/15/2006 and no changes required. father- deceased-87-CVA mother-deceased-87-DM, CVA sister x1 brother x2 daughter-32  Social History: Reviewed history from 10/07/2008 and no changes required. Married lives with spouse Current Smoker-1.5ppd x35 year. Alcohol use-no Drug use-no Works making  beer cans--works at [employer]  Review of Systems      See HPI  Physical Exam  General:  alert, well-developed, and well-nourished.     Impression & Recommendations:  Problem # 1:  FATIGUE (ICD-780.79) Hgb is borderline so will check some basic labs.  I suspect this is due to the heat and humidity and being active outdoors in the hot weather.   Orders: T-Basic Metabolic Panel [phone]) T-CBC w/Diff [phone]) T-TSH [phone]) Hgb (57846)  Problem # 2:  HYPERTENSION (ICD-401.9) BP better but not ideal.  I do not want to increase the amlodipine right now as it might be causing the fatigue.  Once we have #1 sorted out, will probably be able to increase amlodipine for better blood pressure control.   His updated medication list for this problem includes:    Lisinopril 40 Mg Tabs (Lisinopril) .Marland Kitchen... 1 by mouth two times a day    Amlodipine Besylate 5 Mg Tabs (Amlodipine besylate) .Marland Kitchen... Take 1 tablet by mouth once a day  Problem # 3:  HYPERLIPIDEMIA (ICD-272.4) Discussed lifestyle modification for triglyceride control with low fat diet, increased exercise, avoidance of alcohol.  If that is not effective, I did explain that there are prescription medications that can control the triglycerides. His updated medication list for this problem includes:    Pravastatin Sodium 40 Mg Tabs (Pravastatin sodium) .Marland Kitchen... 1 by mouth once daily  Problem # 4:  Preventive Health Care (ICD-V70.0) We discussed the benefits of colon cancer screening.  I asked him to simply call when he is ready for colonoscopy and we will make a referral.    Complete Medication List: 1)  Aspirin 81 Mg Tbec (Aspirin) .Marland Kitchen.. 1 by mouth once daily 2)  Metformin Hcl 1000 Mg Tabs (Metformin hcl) .Marland Kitchen.. 1 by mouth two times a day 3)  Lisinopril 40 Mg Tabs (Lisinopril) .Marland Kitchen.. 1 by mouth two times a day 4)  Pravastatin Sodium 40 Mg Tabs (Pravastatin sodium) .Marland Kitchen.. 1 by mouth once daily 5)  Gabapentin 800 Mg Tabs (Gabapentin) .... 2 by  mouth at bedtime 6)  Freestyle Freedom Glucometer Strips  .... Use as directed once daily as needed 7)  Capsaicin Apr 0.075 % Crea (Capsaicin) .... Apply a thin layer to feet at bedtime 8)  Amlodipine Besylate 5 Mg Tabs (Amlodipine besylate) .... Take 1 tablet by mouth once a day  Patient Instructions: 1)  You will be called with the results when they are available. 2)  Please schedule a follow-up appointment in 6 weeks.  Laboratory Results   Blood Tests   Date/Time Received: February 18, 2009 10:58 AM     CBC   HGB:  12.0 g/dL   (Normal Range: 16.1-09.6 in Males, 12.0-15.0 in Females)

## 2010-09-29 NOTE — Letter (Signed)
Summary: Medical History Form, Medication List  Medical History Form, Medication List   Imported By: Lutricia Horsfall LPN 36/64/4034 74:25:95  _____________________________________________________________________  External Attachment:    Type:   Image     Comment:   External Document

## 2010-09-29 NOTE — Miscellaneous (Signed)
Summary: Home Care Report  Home Care Report   Imported By: Curtis Sites 04/11/2009 15:29:19  _____________________________________________________________________  External Attachment:    Type:   Image     Comment:   External Document

## 2010-09-29 NOTE — Progress Notes (Signed)
Summary: 09/22/07 lab reports  Phone Note Outgoing Call   Call placed by: Sonny Dandy,  September 25, 2007 11:04 AM Summary of Call: Results given to patient's wife, voices understanding   .................................................................Marland KitchenMarland KitchenSonny Dandy  September 25, 2007 11:04 AM  Initial call taken by: Sonny Dandy,  September 25, 2007 11:04 AM

## 2010-09-29 NOTE — Letter (Signed)
Summary: New Patient Screening Form  New Patient Screening Form   Imported By: Lutricia Horsfall LPN 16/05/9603 54:09:81  _____________________________________________________________________  External Attachment:    Type:   Image     Comment:   External Document

## 2010-09-29 NOTE — Assessment & Plan Note (Signed)
Summary: DM/HTN   Vital Signs:  Patient Profile:   70 Years Old Male O2 Sat:      97 % O2 treatment:    Room Air Pulse rate:   83 / minute Resp:     10 per minute BP sitting:   142 / 96  (left arm)  Vitals Entered By: Lutricia Horsfall (July 08, 2008 3:57 PM)             CBG Result 128     Chief Complaint:  followup DM.  History of Present Illness: Patient presents today for routine follow up.  He has been checking his sugars but in the past month he has had 3 times in the morning when his sugar have been in the 160s and he waited 15 minutes and checked it again and it was in the 100s.    He does check his blood pressures at [facility] and doesn't remember the numbers but thinks it was in the 140/80's range.  He is going to be able to get his flu shot at work this week.      Current Allergies: No known allergies   Past Medical History:    Reviewed history from 12/15/2006 and no changes required:       Hyperlipidemia       NIDDM, controlled, multiple complications       peripheral neuropathy       tobacco abuse       microalbuminuria       hypertension       diabetic foot ulcer, right, hx  Past Surgical History:    Reviewed history from 12/15/2006 and no changes required:       Appendectomy-15 years ago   Family History:    Reviewed history from 12/15/2006 and no changes required:       father- deceased-87-CVA       mother-deceased-87-DM, CVA       sister x1       brother x2       daughter-32  Social History:    Reviewed history from 09/22/2007 and no changes required:       Married lives with spouse       Current Smoker-1.5ppd x35 years, now 1ppd.       Alcohol use-no       Drug use-no       Works making beer cans--works at [employer]    Review of Systems      See HPI   Physical Exam  General:     alert, well-developed, and well-nourished.   Extremities:     No edema, calf tenderness or swelling.     Impression & Recommendations:  Problem #  1:  DIABETES MELLITUS, TYPE II, CONTROLLED, WITH COMPLICATIONS (ICD-250.90) Diabetes control looks good and no changes will be made in medications at this time.  Hemoglobin A1c will be rechecked in 3 months.  He is UTD on labs.  He will get his flu shot at work.  It sounds like his glucometer might be having a problem and he has had it over 2 years so I have given him a prescription for a new one.    His updated medication list for this problem includes:    Aspirin 81 Mg Tbec (Aspirin) .Marland Kitchen... 1 by mouth once daily    Metformin Hcl 500 Mg Tabs (Metformin hcl) .Marland Kitchen... 1 by mouth two times a day    Lisinopril 40 Mg Tabs (Lisinopril) .Marland Kitchen... 1 by mouth two times  a day  Orders: Capillary Blood Glucose (82948) Hemoglobin A1C (83036)   Problem # 2:  HYPERTENSION (ICD-401.9) Blood pressure remains too high.  WIll increase lisinopril to 40mg  two times a day as this should help the microalbuminuria also.  He is going to check his blood pressure at least once a week and get the results back to me in 4 weeks.  His updated medication list for this problem includes:    Lisinopril 40 Mg Tabs (Lisinopril) .Marland Kitchen... 1 by mouth two times a day   Complete Medication List: 1)  Aspirin 81 Mg Tbec (Aspirin) .Marland Kitchen.. 1 by mouth once daily 2)  Metformin Hcl 500 Mg Tabs (Metformin hcl) .Marland Kitchen.. 1 by mouth two times a day 3)  Lisinopril 40 Mg Tabs (Lisinopril) .Marland Kitchen.. 1 by mouth two times a day 4)  Pravastatin Sodium 40 Mg Tabs (Pravastatin sodium) .Marland Kitchen.. 1 by mouth once daily 5)  Gabapentin 600 Mg Tabs (Gabapentin) .... 2 by mouth at bedtime 6)  Freestyle Freedom Glucometer Strips  .... Use as directed once daily as needed 7)  Capsaicin Apr 0.075 % Crea (Capsaicin) .... Apply a thin layer to feet at bedtime   Patient Instructions: 1)  Please schedule a follow-up appointment in 3 months.   Prescriptions: GABAPENTIN 600 MG  TABS (GABAPENTIN) 2 by mouth at bedtime  #180 x 3   Entered and Authorized by:   Erle Crocker MD    Signed by:   Erle Crocker MD on 07/08/2008   Method used:   Electronically to        [facility] (retail)       [address]       [place], [place]  [address]       Ph: [phone]       Fax: [phone]   RxID:   [id number] PRAVASTATIN SODIUM 40 MG TABS (PRAVASTATIN SODIUM) 1 by mouth once daily  #90 x 3   Entered and Authorized by:   Erle Crocker MD   Signed by:   Erle Crocker MD on 07/08/2008   Method used:   Electronically to        [facility] (retail)       [address]       [place], [place]  [address]       Ph: [phone]       Fax: [phone]   RxID:   [id number] LISINOPRIL 40 MG TABS (LISINOPRIL) 1 by mouth two times a day  #180 x 3   Entered and Authorized by:   Erle Crocker MD   Signed by:   Erle Crocker MD on 07/08/2008   Method used:   Electronically to        [facility] (retail)       [address]       [place], [place]  [address]       Ph: [phone]       Fax: [phone]   RxID:   [id number] METFORMIN HCL 500 MG TABS (METFORMIN HCL) 1 by mouth two times a day  #180 x 3   Entered and Authorized by:   Erle Crocker MD   Signed by:   Erle Crocker MD on 07/08/2008   Method used:   Electronically to        [facility] (retail)       [address]       [place], [place]  [address]       Ph:  [phone]       Fax: [phone]   RxID:   [id number]  ] Laboratory Results   Blood Tests   Date/Time Received: July 08, 2008 3:57 PM   HGBA1C: 6.7%   (Normal Range: Non-Diabetic - 3-6%   Control Diabetic - 6-8%) CBG Random:: 128mg /dL

## 2010-09-29 NOTE — Miscellaneous (Signed)
Summary: diabeties record  diabeties record   Imported By: Curtis Sites 07/15/2008 09:08:38  _____________________________________________________________________  External Attachment:    Type:   Image     Comment:   External Document

## 2010-09-29 NOTE — Letter (Signed)
Summary: [facility] Office Visits  [facility] Office Visits   Imported By: Lutricia Horsfall LPN 09/32/3557 32:20:25  _____________________________________________________________________  External Attachment:    Type:   Image     Comment:   External Document

## 2010-09-29 NOTE — Letter (Signed)
Summary: Home Glucose Monitoring  Home Glucose Monitoring   Imported By: Lutricia Horsfall 09/28/2007 15:36:08  _____________________________________________________________________  External Attachment:    Type:   Image     Comment:   External Document

## 2010-09-29 NOTE — Letter (Signed)
Summary: Receipt of Privacy Practices  Receipt of Privacy Practices   Imported By: Lutricia Horsfall LPN 66/44/0347 42:59:56  _____________________________________________________________________  External Attachment:    Type:   Image     Comment:   External Document

## 2010-09-29 NOTE — Letter (Signed)
Summary: diabetic foot screen  diabetic foot screen   Imported By: Donneta Romberg 12/22/2006 15:57:54  _____________________________________________________________________  External Attachment:    Type:   Image     Comment:   External Document

## 2011-01-15 NOTE — Op Note (Signed)
[place]  Patient:    Eric Ochoa, Eric Ochoa                           MRN: [id number] Proc. Date: 11/25/99 Attending:  Lowell Bouton, M.D.                           Operative Report  PREOPERATIVE DIAGNOSIS:  Trigger finger, right ring finger.  POSTOPERATIVE DIAGNOSIS:  Trigger finger, right ring finger.  OPERATION:  Release of A1 pulley, right ring finger.  SURGEON:  Lowell Bouton, M.D.  ANESTHESIA:  1/2% Marcaine local with sedation  OPERATIVE FINDINGS:  The patient had some thickening of the tenosynovium around the flexor tendon beneath the A1 pulley.  There was small nodular formation in the tendon.  DESCRIPTION OF PROCEDURE:  Under 1/2% Marcaine local anesthesia, with a tourniquet on the right forearm, the right hand was prepped and draped in the usual fashion and after exsanguinating the limb the tourniquet was inflated to 250 mmHg. A transverse incision was made in line with the distal palmar crease at the ring finger.  Blunt dissection was carried through the subcutaneous tissues and the palmar fascia was incised.  A retractor was put in distally and the dissection as carried down to the flexor sheath.  The A1 pulley was identified and was released with the scissors.  The finger was then flexed and the tendon was found to have a small nodule in it which was causing the triggering.  The thickened tenosynovium around the tendon was removed.  The wound was irrigated with saline.  The skin as closed with 4-0 nylon sutures.  Sterile dressings were applied.  The tourniquet was released with good circulation of the hand.  The patient went to the recovery room awake and stable in good condition. DD:  11/25/99 TD:  11/26/99 Job: [id number]

## 2013-05-24 ENCOUNTER — Encounter: Payer: Self-pay | Admitting: Internal Medicine

## 2016-10-18 ENCOUNTER — Telehealth: Payer: Self-pay

## 2016-10-18 ENCOUNTER — Ambulatory Visit (INDEPENDENT_AMBULATORY_CARE_PROVIDER_SITE_OTHER): Payer: BLUE CROSS/BLUE SHIELD | Admitting: Nurse Practitioner

## 2016-10-18 ENCOUNTER — Other Ambulatory Visit: Payer: Self-pay

## 2016-10-18 ENCOUNTER — Encounter: Payer: Self-pay | Admitting: Nurse Practitioner

## 2016-10-18 DIAGNOSIS — D509 Iron deficiency anemia, unspecified: Secondary | ICD-10-CM | POA: Diagnosis not present

## 2016-10-18 NOTE — Assessment & Plan Note (Addendum)
Review of labs by primary care show low hemoglobin, normocytic and hypochromic. His iron deficiency anemia is concerning in light of the fact he is 76 years old and has never had a colonoscopy before. Generally asymptomatic from a GI standpoint. Last endoscopy approximately 40 years ago.   At this point we will proceed with colonoscopy and endoscopy to further evaluate. We will provide for movie prep low volume prep, hold iron 7 days prior, half dose diabetes medicines and I before and none the morning of. He may eventually need a Givens capsule endoscopy pending results of colonoscopy and endoscopy. Return for follow-up based on postprocedure recommendations or in 2 months.  Proceed with colonoscopy and EGD with Dr. Oneida Alar in the near future. The risks, benefits, and alternatives have been discussed in detail with the patient. They state understanding and desire to proceed.   The patient is not on any anticoagulants, anxiolytics, chronic pain medications, antidepressants. Conscious sedation should be adequate for his procedure.

## 2016-10-18 NOTE — Progress Notes (Addendum)
REVIEWED-NO ADDITIONAL RECOMMENDATIONS.  Primary Care Physician:  Wende Neighbors, MD Primary Gastroenterologist:  Dr. Oneida Alar  Chief Complaint  Patient presents with  . Anemia    HPI:   Eric Ochoa is a 76 y.o. male who presents on referral from primary care for iron deficiency anemia. No history of colonoscopy or endoscopy could be found in our system. PCP notes reviewed. The patient was seen on 10/13/2016 by primary care for annual visit/anal wellness visit. Generally asymptomatic at that time. Denies ever having a colonoscopy. Labs reviewed which shows normal kidney function, normal liver function, hemoglobin of 12.4, hematocrit 38.2, hypochromic with a low MCH. Total iron low at 11, saturation low at 3%. He was restarted on iron pills at that time. No ferritin drawn.  Today she states he's doing well overall. Hs never had colonoscopy. Had EGD 40 years ago. Denies abdominal pain, N/V other than single self-limited episode of vomiting last week ("I think I had a touch of the flu.") Denies hematochezia, melena (just restarted iron yesterday). Has a bowel movement about 1-2 times a day consistent with [place] 4 and no straining required. Denies hemorrhoids. Denies fever, chills, unintentional weight loss, acute changes in stool characteristics. Has some fatigue/weakness "but I blame that on my age, it's not bad and not new." Denies chest pain, dyspnea, dizziness, lightheadedness, syncope, near syncope. Denies any other upper or lower GI symptoms.  Past Medical History:  Diagnosis Date  . Abnormal results of kidney function studies   . Abnormal weight loss   . Disorder of kidney and ureter   . Essential hypertension   . Hereditary and idiopathic neuropathy   . Hypercalcemia   . Iron deficiency anemia   . Mixed hyperlipidemia   . Mononeuritis multiplex   . Tobacco use   . Type 2 diabetes mellitus ([place])     Past Surgical History:  Procedure Laterality Date  . LAPAROSCOPIC APPENDECTOMY  N/A 1975    Current Outpatient Prescriptions  Medication Sig Dispense Refill  . amLODipine (NORVASC) 10 MG tablet     . aspirin EC 81 MG tablet Take 81 mg by mouth daily.    . Choline Fenofibrate (FENOFIBRIC ACID) 135 MG CPDR     . ferrous sulfate 325 (65 FE) MG tablet Take 325 mg by mouth daily with breakfast.    . gabapentin (NEURONTIN) 800 MG tablet     . losartan (COZAAR) 100 MG tablet     . metFORMIN (GLUCOPHAGE) 1000 MG tablet     . omega-3 acid ethyl esters (LOVAZA) 1 g capsule Take 1 g by mouth 2 (two) times daily.    . simvastatin (ZOCOR) 40 MG tablet      No current facility-administered medications for this visit.     Allergies as of 10/18/2016  . (No Known Allergies)    Family History  Problem Relation Age of Onset  . Colon cancer Neg Hx     Social History   Social History  . Marital status: Married    Spouse name: N/A  . Number of children: N/A  . Years of education: N/A   Occupational History  . Not on file.   Social History Main Topics  . Smoking status: Current Every Day Smoker    Packs/day: 1.00    Types: Cigarettes  . Smokeless tobacco: Never Used  . Alcohol use No  . Drug use: No  . Sexual activity: Not on file   Other Topics Concern  . Not on file  Social History Narrative  . No narrative on file    Review of Systems: Complete ROS negative except as per HPI.   Physical Exam: BP (!) 141/74   Pulse 73   Temp 98 F (36.7 C) (Oral)   Ht 6' (1.829 m)   Wt 156 lb 9.6 oz (71 kg)   BMI 21.24 kg/m  General:   Alert and oriented. Pleasant and cooperative. Well-nourished and well-developed.  Head:  Normocephalic and atraumatic. Eyes:  Without icterus, sclera clear and conjunctiva pink.  Ears:  Normal auditory acuity. Cardiovascular:  S1, S2 present without murmurs appreciated. Extremities without clubbing or edema. Respiratory:  Clear to auscultation bilaterally. No wheezes, rales, or rhonchi. No distress.  Gastrointestinal:  +BS,  soft, non-tender and non-distended. No HSM noted. No guarding or rebound. No masses appreciated.  Rectal:  Deferred  Musculoskalatal:  Symmetrical without gross deformities. Neurologic:  Alert and oriented x4;  grossly normal neurologically. Psych:  Alert and cooperative. Normal mood and affect. Heme/Lymph/Immune: No excessive bruising noted.    10/18/2016 2:34 PM   Disclaimer: This note was dictated with voice recognition software. Similar sounding words can inadvertently be transcribed and may not be corrected upon review.

## 2016-10-18 NOTE — Telephone Encounter (Signed)
PA info faxed to [facility] for TCS/EGD.

## 2016-10-18 NOTE — Progress Notes (Signed)
CC'D TO PCP °

## 2016-10-18 NOTE — Patient Instructions (Addendum)
1. We will schedule your procedures for you. 2. Do not take your iron pills for 7 days prior to your procedure. 3. The night before your procedure, only take a half a dose of metformin. The morning of your procedure do not take any metformin. 4. Return for follow-up in 2 months or based on recommendations made after your procedures. 5. As we discussed, you may eventually need a pill endoscopy. We can discuss this at her follow-up visit.

## 2016-10-19 ENCOUNTER — Telehealth: Payer: Self-pay | Admitting: Gastroenterology

## 2016-10-19 NOTE — Telephone Encounter (Signed)
PLEASE CALL PATIENT, HE SAID ERIC MENTIONED SOMETHING ABOUT A TEST OTHER THAN A COLONOSCOPY AND HE HAD QUESTIONS [phone]

## 2016-10-21 NOTE — Telephone Encounter (Signed)
LMOM for a return call.  

## 2016-10-22 NOTE — Telephone Encounter (Signed)
Pt called and just wanted to know if he was having EGD and TCS at the same time and I told him yes. He said that is all that he needed to know.

## 2016-10-25 ENCOUNTER — Telehealth: Payer: Self-pay | Admitting: Gastroenterology

## 2016-10-25 NOTE — Telephone Encounter (Signed)
Pt is aware.  

## 2016-10-25 NOTE — Telephone Encounter (Signed)
Pt called to check to see if we were in network with his insurance or not. He said that he called [employer] and was told we were not in network and he isn't wanting to pay a lot more out of pocket if we aren't. Please advise and call him at [phone]

## 2016-10-25 NOTE — Telephone Encounter (Signed)
Talked with Alyssa with [place] she said that if we are in with our local [employer] that he would be fine. Also he does not need a PA for the TCS/EGD.

## 2016-10-26 ENCOUNTER — Telehealth: Payer: Self-pay

## 2016-10-26 NOTE — Telephone Encounter (Signed)
Called pt d/t time change for TCS/EGD scheduled for 11/01/16. Procedure time changed to 2:15pm, arrive at 1:15pm. Instructed to change times to drink prep morning of procedure to 10:15am, NPO after 12:15pm.

## 2016-10-31 ENCOUNTER — Encounter (HOSPITAL_COMMUNITY): Payer: Self-pay | Admitting: Gastroenterology

## 2016-11-01 ENCOUNTER — Encounter (HOSPITAL_COMMUNITY)
Admission: RE | Disposition: A | Discharge status: Home / Self Care | Payer: Self-pay | Source: Ambulatory Visit | Attending: Gastroenterology

## 2016-11-01 ENCOUNTER — Ambulatory Visit (HOSPITAL_COMMUNITY)
Admission: RE | Admit: 2016-11-01 | Discharge: 2016-11-01 | Disposition: A | Discharge status: Home / Self Care | Payer: BLUE CROSS/BLUE SHIELD | Source: Ambulatory Visit | Attending: Gastroenterology | Admitting: Gastroenterology

## 2016-11-01 ENCOUNTER — Encounter (HOSPITAL_COMMUNITY): Payer: Self-pay | Admitting: *Deleted

## 2016-11-01 ENCOUNTER — Telehealth: Payer: Self-pay

## 2016-11-01 DIAGNOSIS — D649 Anemia, unspecified: Secondary | ICD-10-CM | POA: Insufficient documentation

## 2016-11-01 DIAGNOSIS — D123 Benign neoplasm of transverse colon: Secondary | ICD-10-CM | POA: Insufficient documentation

## 2016-11-01 DIAGNOSIS — Z7984 Long term (current) use of oral hypoglycemic drugs: Secondary | ICD-10-CM | POA: Diagnosis not present

## 2016-11-01 DIAGNOSIS — G608 Other hereditary and idiopathic neuropathies: Secondary | ICD-10-CM | POA: Insufficient documentation

## 2016-11-01 DIAGNOSIS — D125 Benign neoplasm of sigmoid colon: Secondary | ICD-10-CM | POA: Diagnosis not present

## 2016-11-01 DIAGNOSIS — K648 Other hemorrhoids: Secondary | ICD-10-CM | POA: Diagnosis not present

## 2016-11-01 DIAGNOSIS — K5730 Diverticulosis of large intestine without perforation or abscess without bleeding: Secondary | ICD-10-CM | POA: Diagnosis not present

## 2016-11-01 DIAGNOSIS — Z79899 Other long term (current) drug therapy: Secondary | ICD-10-CM | POA: Insufficient documentation

## 2016-11-01 DIAGNOSIS — K2980 Duodenitis without bleeding: Secondary | ICD-10-CM | POA: Diagnosis not present

## 2016-11-01 DIAGNOSIS — D509 Iron deficiency anemia, unspecified: Secondary | ICD-10-CM

## 2016-11-01 DIAGNOSIS — F17210 Nicotine dependence, cigarettes, uncomplicated: Secondary | ICD-10-CM | POA: Insufficient documentation

## 2016-11-01 DIAGNOSIS — R634 Abnormal weight loss: Secondary | ICD-10-CM | POA: Diagnosis not present

## 2016-11-01 DIAGNOSIS — B9681 Helicobacter pylori [H. pylori] as the cause of diseases classified elsewhere: Secondary | ICD-10-CM | POA: Diagnosis not present

## 2016-11-01 DIAGNOSIS — E119 Type 2 diabetes mellitus without complications: Secondary | ICD-10-CM | POA: Insufficient documentation

## 2016-11-01 DIAGNOSIS — K2970 Gastritis, unspecified, without bleeding: Secondary | ICD-10-CM

## 2016-11-01 DIAGNOSIS — I10 Essential (primary) hypertension: Secondary | ICD-10-CM | POA: Diagnosis not present

## 2016-11-01 DIAGNOSIS — K2950 Unspecified chronic gastritis without bleeding: Secondary | ICD-10-CM | POA: Diagnosis not present

## 2016-11-01 DIAGNOSIS — Z7982 Long term (current) use of aspirin: Secondary | ICD-10-CM | POA: Diagnosis not present

## 2016-11-01 DIAGNOSIS — K644 Residual hemorrhoidal skin tags: Secondary | ICD-10-CM | POA: Diagnosis not present

## 2016-11-01 DIAGNOSIS — N289 Disorder of kidney and ureter, unspecified: Secondary | ICD-10-CM | POA: Diagnosis not present

## 2016-11-01 DIAGNOSIS — E782 Mixed hyperlipidemia: Secondary | ICD-10-CM | POA: Diagnosis not present

## 2016-11-01 DIAGNOSIS — Q438 Other specified congenital malformations of intestine: Secondary | ICD-10-CM | POA: Insufficient documentation

## 2016-11-01 DIAGNOSIS — D124 Benign neoplasm of descending colon: Secondary | ICD-10-CM | POA: Insufficient documentation

## 2016-11-01 HISTORY — PX: ESOPHAGOGASTRODUODENOSCOPY: SHX5428

## 2016-11-01 HISTORY — PX: COLONOSCOPY: SHX5424

## 2016-11-01 LAB — GLUCOSE, CAPILLARY: Glucose-Capillary: 88 mg/dL (ref 65–99)

## 2016-11-01 LAB — FERRITIN: Ferritin: 62 ng/mL (ref 24–336)

## 2016-11-01 SURGERY — COLONOSCOPY
Anesthesia: Moderate Sedation

## 2016-11-01 MED ORDER — MEPERIDINE HCL 100 MG/ML IJ SOLN
INTRAMUSCULAR | Status: AC
  Filled 2016-11-01: qty 2

## 2016-11-01 MED ORDER — LIDOCAINE VISCOUS 2 % MT SOLN
OROMUCOSAL | Status: AC
  Filled 2016-11-01: qty 15

## 2016-11-01 MED ORDER — MEPERIDINE HCL 100 MG/ML IJ SOLN
INTRAMUSCULAR | Status: DC | PRN
  Administered 2016-11-01: 50 mg via INTRAVENOUS
  Administered 2016-11-01 (×2): 25 mg via INTRAVENOUS

## 2016-11-01 MED ORDER — MIDAZOLAM HCL 5 MG/5ML IJ SOLN
INTRAMUSCULAR | Status: DC | PRN
  Administered 2016-11-01 (×2): 2 mg via INTRAVENOUS
  Administered 2016-11-01: 1 mg via INTRAVENOUS
  Administered 2016-11-01: 2 mg via INTRAVENOUS

## 2016-11-01 MED ORDER — MIDAZOLAM HCL 5 MG/5ML IJ SOLN
INTRAMUSCULAR | Status: AC
  Filled 2016-11-01: qty 10

## 2016-11-01 MED ORDER — STERILE WATER FOR IRRIGATION IR SOLN
Status: DC | PRN
  Administered 2016-11-01: 100 mL

## 2016-11-01 MED ORDER — SODIUM CHLORIDE 0.9 % IV SOLN
INTRAVENOUS | Status: DC
  Administered 2016-11-01: 14:00:00 via INTRAVENOUS

## 2016-11-01 NOTE — H&P (Signed)
Primary Care Physician:  Wende Neighbors, MD Primary Gastroenterologist:  Dr. Oneida Alar  Pre-Procedure History & Physical: HPI:  Eric Ochoa is a 76 y.o. male here for Anemia-normocytic with iron SAT 3% AND Cr 1.77(JAN 2018).  Past Medical History:  Diagnosis Date  . Abnormal results of kidney function studies   . Abnormal weight loss   . Disorder of kidney and ureter   . Essential hypertension   . Hereditary and idiopathic neuropathy   . Hypercalcemia   . Iron deficiency anemia   . Mixed hyperlipidemia   . Mononeuritis multiplex   . Tobacco use   . Type 2 diabetes mellitus ([place])     Past Surgical History:  Procedure Laterality Date  . LAPAROSCOPIC APPENDECTOMY N/A 1975    Prior to Admission medications   Medication Sig Start Date End Date Taking? Authorizing Provider  amLODipine (NORVASC) 10 MG tablet Take 10 mg by mouth daily.  10/05/16  Yes Historical Provider, MD  aspirin EC 81 MG tablet Take 81 mg by mouth daily.   Yes Historical Provider, MD  Choline Fenofibrate (FENOFIBRIC ACID) 135 MG CPDR Take 135 mg by mouth daily.  10/05/16  Yes Historical Provider, MD  gabapentin (NEURONTIN) 800 MG tablet Take 800 mg by mouth at bedtime.  10/05/16  Yes Historical Provider, MD  losartan (COZAAR) 100 MG tablet Take 100 mg by mouth daily.  09/06/16  Yes Historical Provider, MD  metFORMIN (GLUCOPHAGE) 1000 MG tablet Take 1,000 mg by mouth daily with breakfast.  10/04/16  Yes Historical Provider, MD  simvastatin (ZOCOR) 40 MG tablet Take 40 mg by mouth daily.  09/06/16  Yes Historical Provider, MD  ferrous sulfate 325 (65 FE) MG tablet Take 325 mg by mouth daily with breakfast.    Historical Provider, MD    Allergies as of 10/18/2016  . (No Known Allergies)    Family History  Problem Relation Age of Onset  . Colon cancer Neg Hx     Social History   Social History  . Marital status: Married    Spouse name: N/A  . Number of children: N/A  . Years of education: N/A   Occupational History  .  Not on file.   Social History Main Topics  . Smoking status: Current Every Day Smoker    Packs/day: 0.50    Years: 30.00    Types: Cigarettes  . Smokeless tobacco: Never Used  . Alcohol use No  . Drug use: No  . Sexual activity: Not on file   Other Topics Concern  . Not on file   Social History Narrative  . No narrative on file    Review of Systems: See HPI, otherwise negative ROS   Physical Exam: BP (!) 155/69   Pulse 75   Temp 97.9 F (36.6 C) (Oral)   Resp 19   SpO2 99%  General:   Alert,  pleasant and cooperative in NAD Head:  Normocephalic and atraumatic. Neck:  Supple; Lungs:  Clear throughout to auscultation.    Heart:  Regular rate and rhythm. Abdomen:  Soft, nontender and nondistended. Normal bowel sounds, without guarding, and without rebound.   Neurologic:  Alert and  oriented x4;  grossly normal neurologically.  Impression/Plan:     Anemia-normocytic with iron SAT 3% AND Cr 1.77(JAN 2018)  PLAN:  1. TCS/EGD TODAY. DISCUSSED PROCEDURE, BENEFITS, & RISKS: < 1% chance of medication reaction, bleeding, perforation, or rupture of spleen/liver.

## 2016-11-01 NOTE — Op Note (Signed)
[facility] Patient Name: Eric Ochoa Procedure Date: 11/01/2016 2:18 PM MRN: [id number] Date of Birth: May 01, 1941 Attending MD: Barney Drain , MD CSN: [id number] Age: 76 Admit Type: Outpatient Procedure:                Colonoscopy WITH COLD FORCEPS/SNARE CAUTERY                            POLYPECTOMY Indications:              Anemia Providers:                Barney Drain, MD, Hinton Rao, RN, Aram Candela Referring MD:             Delphina Cahill, MD Medicines:                Meperidine 100 mg IV, Midazolam 5 mg IV Complications:            No immediate complications. Estimated Blood Loss:     Estimated blood loss was minimal. Procedure:                Pre-Anesthesia Assessment:                           - Prior to the procedure, a History and Physical                            was performed, and patient medications and                            allergies were reviewed. The patient's tolerance of                            previous anesthesia was also reviewed. The risks                            and benefits of the procedure and the sedation                            options and risks were discussed with the patient.                            All questions were answered, and informed consent                            was obtained. Prior Anticoagulants: The patient has                            taken aspirin, last dose was 1 day prior to                            procedure. ASA Grade Assessment: II - A patient                            with mild systemic disease. After reviewing the  risks and benefits, the patient was deemed in                            satisfactory condition to undergo the procedure.                           After obtaining informed consent, the colonoscope                            was passed under direct vision. Throughout the                            procedure, the patient's blood pressure, pulse, and                             oxygen saturations were monitored continuously. The                            [id number]) scope was introduced through                            the anus and advanced to the 10 cm into the ileum.                            The colonoscopy was somewhat difficult due to a                            tortuous colon. Successful completion of the                            procedure was aided by increasing the dose of                            sedation medication, applying abdominal pressure                            and COLOWRAP. The patient tolerated the procedure                            fairly well. The quality of the bowel preparation                            was good. The terminal ileum, ileocecal valve,                            appendiceal orifice, and rectum were photographed. Scope In: 3:11:59 PM Scope Out: 3:40:36 PM Scope Withdrawal Time: 0 hours 23 minutes 13 seconds  Total Procedure Duration: 0 hours 28 minutes 37 seconds  Findings:      The terminal ileum appeared normal.      Five sessile polyps were found in the sigmoid colon, descending colon       and splenic flexure. The polyps were 5 to 7 mm in size. These polyps       were removed  with a hot snare. Resection and retrieval were complete.      A 3 mm polyp was found in the sigmoid colon. The polyp was sessile. The       polyp was removed with a cold biopsy forceps. Resection and retrieval       were complete.      Multiple small and large-mouthed diverticula were found in the       recto-sigmoid colon, sigmoid colon and descending colon.      Non-bleeding external and internal hemorrhoids were found during       retroflexion. Impression:               - ANEMIA MOST LIKELY MULTIFACTORIAL-POLYPS,                            GASTRITIS, RENAL INSUFFICIENCY                           - Five 5 to 7 mm polyps in the sigmoid colon, in                            the descending colon and at the splenic flexure,                             removed with a hot snare. Resected and retrieved.                           - One 3 mm polyp in the sigmoid colon, removed with                            a cold biopsy forceps. Resected and retrieved.                           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                           - Non-bleeding external and internal hemorrhoids. Moderate Sedation:      Moderate (conscious) sedation was administered by the endoscopy nurse       and supervised by the endoscopist. The following parameters were       monitored: oxygen saturation, heart rate, blood pressure, and response       to care. Total physician intraservice time was 57 minutes. Recommendation:           - High fiber diet.                           - Continue present medications.                           - Await  pathology results. CHECK FERRITIN TODAY                           - Repeat colonoscopy in 5-10 years for surveillance.                           - Return to GI office in 3 months.                           - Patient has a contact number available for                            emergencies. The signs and symptoms of potential                            delayed complications were discussed with the                            patient. Return to normal activities tomorrow.                            Written discharge instructions were provided to the                            patient. Procedure Code(s):        --- Professional ---                           [phone], Colonoscopy, flexible; with removal of                            tumor(s), polyp(s), or other lesion(s) by snare                            technique                           45380, 59, Colonoscopy, flexible; with biopsy,                            single or multiple                            99152, Moderate sedation services provided by the                            same physician or other qualified health care                            professional performing the diagnostic or                            therapeutic service that the sedation supports,                            requiring the presence  of an independent trained                            observer to assist in the monitoring of the                            patient's level of consciousness and physiological                            status; initial 15 minutes of intraservice time,                            patient age 63 years or older                           [phone], Moderate sedation services; each additional                            15 minutes intraservice time                           [phone], Moderate sedation services; each additional                            15 minutes intraservice time                           99153, Moderate sedation services; each additional                            15 minutes intraservice time Diagnosis Code(s):        --- Professional ---                           K64.8, Other hemorrhoids                           D12.5, Benign neoplasm of sigmoid colon                           D12.4, Benign neoplasm of descending colon                           D12.3, Benign neoplasm of transverse colon (hepatic                            flexure or splenic flexure)                           D64.9, Anemia, unspecified                           K57.30, Diverticulosis of large intestine without                            perforation or abscess without bleeding CPT copyright 2016 American [employer] Association. All rights reserved. The  codes documented in this report are preliminary and upon coder review may  be revised to meet current compliance requirements. Barney Drain, MD Barney Drain, MD 11/01/2016 4:24:32 PM This report has been signed electronically. Number of  Addenda: 0

## 2016-11-01 NOTE — Discharge Instructions (Signed)
You had 6 polyps removed. You have internal AND EXTERNAL hemorrhoids. YOU HAVE DIVERTICULOSIS IN YOUR RIGHT AND LEFT COLON. You have MILD gastritis & duodenitis MOST LIKELY DUE TO ASPIRIN.  I biopsied your stomach AND SMALL BOWEL.    I WILL CHECK YOUR IRON STORES TODAY.  DRINK WATER TO KEEP YOUR URINE LIGHT YELLOW.  FOLLOW A HIGH FIBER/LOW FAT DIET. AVOID ITEMS THAT CAUSE BLOATING.   YOUR BIOPSY RESULTS WILL BE AVAILABLE IN MY CHART AFTER MAR 7 AND MY OFFICE WILL CONTACT YOU IN 10-14 DAYS WITH YOUR RESULTS.   Next colonoscopy in 5-10 years IF THE BENEFITS OUTWEIGH THE RISKS.   FOLLOW UP IN 4 MOS.   ENDOSCOPY Care After Read the instructions outlined below and refer to this sheet in the next week. These discharge instructions provide you with general information on caring for yourself after you leave the hospital. While your treatment has been planned according to the most current medical practices available, unavoidable complications occasionally occur. If you have any problems or questions after discharge, call DR. Kensi Karr, [phone].  ACTIVITY  You may resume your regular activity, but move at a slower pace for the next 24 hours.   Take frequent rest periods for the next 24 hours.   Walking will help get rid of the air and reduce the bloated feeling in your belly (abdomen).   No driving for 24 hours (because of the medicine (anesthesia) used during the test).   You may shower.   Do not sign any important legal documents or operate any machinery for 24 hours (because of the anesthesia used during the test).    NUTRITION  Drink plenty of fluids.   You may resume your normal diet as instructed by your doctor.   Begin with a light meal and progress to your normal diet. Heavy or fried foods are harder to digest and may make you feel sick to your stomach (nauseated).   Avoid alcoholic beverages for 24 hours or as instructed.    MEDICATIONS  You may resume your normal  medications.   WHAT YOU CAN EXPECT TODAY  Some feelings of bloating in the abdomen.   Passage of more gas than usual.   Spotting of blood in your stool or on the toilet paper  .  IF YOU HAD POLYPS REMOVED DURING THE ENDOSCOPY:  Eat a soft diet IF YOU HAVE NAUSEA, BLOATING, ABDOMINAL PAIN, OR VOMITING.    FINDING OUT THE RESULTS OF YOUR TEST Not all test results are available during your visit. DR. Oneida Alar WILL CALL YOU WITHIN 14 DAYS OF YOUR PROCEDUE WITH YOUR RESULTS. Do not assume everything is normal if you have not heard from DR. Cynthya Yam, CALL HER OFFICE AT [phone].  SEEK IMMEDIATE MEDICAL ATTENTION AND CALL THE OFFICE: [phone] IF:  You have more than a spotting of blood in your stool.   Your belly is swollen (abdominal distention).   You are nauseated or vomiting.   You have a temperature over 101F.   You have abdominal pain or discomfort that is severe or gets worse throughout the day.   Gastritis/DUODENITIS  Gastritis/DUODENITIS is inflammation (the body's way of reacting to injury and/or infection) of the stomach/SMALLBOWEL. It is often caused by viral or bacterial (germ) infections. It can also be caused BY ASPIRIN, BC/GOODY POWDER'S, (IBUPROFEN) MOTRIN, OR ALEVE (NAPROXEN), chemicals (including alcohol), SPICY FOODS, and medications. This illness may be associated with generalized malaise (feeling tired, not well), UPPER ABDOMINAL STOMACH cramps, and fever. One common  bacterial cause of gastritis is an organism known as H. Pylori. This can be treated with antibiotics.      Diverticulosis Diverticulosis is a common condition that develops when small pouches (diverticula) form in the wall of the colon. The risk of diverticulosis increases with age. It happens more often in people who eat a low-fiber diet. Most individuals with diverticulosis have no symptoms. Those individuals with symptoms usually experience belly (abdominal) pain, constipation, or loose  stools (diarrhea).  HOME CARE INSTRUCTIONS  Increase the amount of fiber in your diet as directed by your caregiver or dietician. This may reduce symptoms of diverticulosis.   Drink at least 6 to 8 glasses of water each day to prevent constipation.   Try not to strain when you have a bowel movement.   Avoiding nuts and seeds to prevent complications is NOT NECESSARY.    FOODS HAVING HIGH FIBER CONTENT INCLUDE:  Fruits. Apple, peach, pear, tangerine, raisins, prunes.   Vegetables. Brussels sprouts, asparagus, broccoli, cabbage, carrot, cauliflower, romaine lettuce, spinach, summer squash, tomato, winter squash, zucchini.   Starchy Vegetables. Baked beans, kidney beans, lima beans, split peas, lentils, potatoes (with skin).   Grains. Whole wheat bread, brown rice, bran flake cereal, plain oatmeal, white rice, shredded wheat, bran muffins.   Polyps, Colon  A polyp is extra tissue that grows inside your body. Colon polyps grow in the large intestine. The large intestine, also called the colon, is part of your digestive system. It is a long, hollow tube at the end of your digestive tract where your body makes and stores stool. Most polyps are not dangerous. They are benign. This means they are not cancerous. But over time, some types of polyps can turn into cancer. Polyps that are smaller than a pea are usually not harmful. But larger polyps could someday become or may already be cancerous. To be safe, doctors remove all polyps and test them.    PREVENTION There is not one sure way to prevent polyps. You might be able to lower your risk of getting them if you:  Eat more fruits and vegetables and less fatty food.   Do not smoke.   Avoid alcohol.   Exercise every day.   Lose weight if you are overweight.   Eating more calcium and folate can also lower your risk of getting polyps. Some foods that are rich in calcium are milk, cheese, and broccoli. Some foods that are rich in folate  are chickpeas, kidney beans, and spinach.

## 2016-11-01 NOTE — Telephone Encounter (Signed)
Pt called office and said he had a Fleets enema with mineral oil and wanted to know if he could use it. He is having procedure today. Advised pt I would ask EG and call him back.  EG advised that pt would need Fleets enema. Mineral oil would be too oily. Called pt back and informed him.

## 2016-11-01 NOTE — Op Note (Signed)
[facility] Patient Name: Eric Ochoa Procedure Date: 11/01/2016 3:42 PM MRN: [id number] Date of Birth: 01-Jul-1941 Attending MD: Barney Drain , MD CSN: [id number] Age: 76 Admit Type: Outpatient Procedure:                Upper GI endoscopy WITH COLD FORCEPS BIOPSY Indications:              Anemia Providers:                Barney Drain, MD, Hinton Rao, RN, Aram Candela,                            Rosina Lowenstein, RN Referring MD:             Delphina Cahill, MD Medicines:                TCS + Midazolam 2 mg IV Complications:            No immediate complications. Estimated Blood Loss:     Estimated blood loss was minimal. Procedure:                Pre-Anesthesia Assessment:                           - Prior to the procedure, a History and Physical                            was performed, and patient medications and                            allergies were reviewed. The patient's tolerance of                            previous anesthesia was also reviewed. The risks                            and benefits of the procedure and the sedation                            options and risks were discussed with the patient.                            All questions were answered, and informed consent                            was obtained. Prior Anticoagulants: The patient has                            taken aspirin, last dose was 1 day prior to                            procedure. ASA Grade Assessment: II - A patient                            with mild systemic disease. After reviewing the  risks and benefits, the patient was deemed in                            satisfactory condition to undergo the procedure.                            After obtaining informed consent, the endoscope was                            passed under direct vision. Throughout the                            procedure, the patient's blood pressure, pulse, and   oxygen saturations were monitored continuously. The                            [id number]) scope was introduced through the                            mouth, and advanced to the second part of duodenum.                            The upper GI endoscopy was somewhat difficult due                            to the patient's agitation. Successful completion                            of the procedure was aided by increasing the dose                            of sedation medication. The patient tolerated the                            procedure fairly well. Scope In: 3:50:04 PM Scope Out: 3:57:09 PM Total Procedure Duration: 0 hours 7 minutes 5 seconds  Findings:      The examined esophagus was normal.      Patchy mild inflammation characterized by congestion (edema) and       erythema was found in the gastric antrum. Biopsies were taken with a       cold forceps for Helicobacter pylori testing.      Patchy mild inflammation characterized by congestion (edema) and       erythema was found in the duodenal bulb.      The second portion of the duodenum was normal. Biopsies for histology       were taken with a cold forceps for evaluation of celiac disease. Impression:               - MILD Gastritis AND DUODENITIS                           - NO SOURCE FOR ANEMIA IDENTIFIED. 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 The following parameters were       monitored: oxygen saturation, heart rate, blood pressure, and response       to care. Total physician intraservice time was 57 minutes. Recommendation:           - High fiber diet.                           - Continue present medications. [place].                           - Await pathology results.  IF NO SOURCE IDENTIFIED,                            CONSIDER GIVENS CAPSULE ENDOSCOPY.                           - Return to my office in 3 months.                           - Patient has a contact number available for                            emergencies. The signs and symptoms of potential                            delayed complications were discussed with the                            patient. Return to normal activities tomorrow.                            Written discharge instructions were provided to the                            patient. Procedure Code(s):        --- Professional ---                           [phone], Esophagogastroduodenoscopy, flexible,                            transoral; with biopsy, single or multiple                           99152, Moderate sedation services provided by the                            same physician or other qualified health care  professional performing the diagnostic or                            therapeutic service that the sedation supports,                            requiring the presence of an independent trained                            observer to assist in the monitoring of the                            patient's level of consciousness and physiological                            status; initial 15 minutes of intraservice time,                            patient age 50 years or older                           [phone], Moderate sedation services; each additional                            15 minutes intraservice time                           99153, Moderate sedation services; each additional                            15 minutes intraservice time                           99153, Moderate sedation services; each additional                            15 minutes intraservice time Diagnosis Code(s):        --- Professional ---                           K29.70, Gastritis, unspecified, without bleeding                            K29.80, Duodenitis without bleeding                           D64.9, Anemia, unspecified CPT copyright 2016 American Medical Association. All rights reserved. The codes documented in this report are preliminary and upon coder review may  be revised to meet current compliance requirements. Barney Drain, MD Barney Drain, MD 11/01/2016 5:20:27 PM This report has been signed electronically. Number of Addenda: 0

## 2016-11-05 ENCOUNTER — Encounter (HOSPITAL_COMMUNITY): Payer: Self-pay | Admitting: Gastroenterology

## 2016-11-24 ENCOUNTER — Telehealth: Payer: Self-pay | Admitting: Gastroenterology

## 2016-11-24 NOTE — Telephone Encounter (Signed)
[place] HIS TCS RESULTS.

## 2016-11-25 MED ORDER — OMEPRAZOLE 20 MG PO CPDR: DELAYED_RELEASE_CAPSULE | ORAL | 11 refills | Status: DC

## 2016-11-25 MED ORDER — CLARITHROMYCIN 500 MG PO TABS: ORAL_TABLET | ORAL | 0 refills | Status: DC

## 2016-11-25 MED ORDER — AMOXICILLIN 500 MG PO TABS: ORAL_TABLET | ORAL | 0 refills | Status: AC

## 2016-11-25 NOTE — Telephone Encounter (Signed)
Pt called office and was informed. 

## 2016-11-25 NOTE — Telephone Encounter (Signed)
Forwarding to Dr. Fields for results.  

## 2016-11-25 NOTE — Telephone Encounter (Signed)
LMOM to call.

## 2016-11-25 NOTE — Telephone Encounter (Signed)
Please call pt. HE had FIVE simple adenomas AND ONE HYPERPLASTIC POLYP REMOVED. His stomach Bx shows gastritis/DUODENITIS DUE TO H PYLORI BACTERIA AND ASPIRIN. HIS IRON STORES ARE NORMAL. PLEASE CALL PT. Her stomach Bx showed H. Pylori infection. IF IT GOES UNTREATED IT CAN LEAD TO STOMACH CANCER. She needs AMOXICILLIN 500 mg 2 po BID for 10 days and Biaxin 500 mg po bid for 10 days. INCREASE Omeprazole 20 mg BID for 30 days then 1 po 30 MINS PRIOR TO BREAKFAST FORever . DO NOT TAKE ZOCOR WHILE TAKING THE ANTIBIOTICS. The meds CAN cause nausea, vomiting, abd cramps, loose stools, black colored stools, and A metallic taste in her mouth   Next colonoscopy in 5-10 years IF THE BENEFITS OUTWEIGH THE RISKS.   FOLLOW UP IN 4 MOS E30 ANEMIA/H PYLORI GASTRITIS.

## 2016-11-25 NOTE — Addendum Note (Signed)
Addended by: Danie Binder on: 11/25/2016 03:12 PM   Modules accepted: Orders

## 2016-11-29 NOTE — Telephone Encounter (Signed)
APPT MADE AND ON RECALL  °

## 2016-11-29 NOTE — Telephone Encounter (Signed)
Noted  

## 2016-12-01 ENCOUNTER — Telehealth: Payer: Self-pay | Admitting: Gastroenterology

## 2016-12-01 NOTE — Telephone Encounter (Signed)
Noted, thanks for clarifying the issue for the patient.

## 2016-12-01 NOTE — Telephone Encounter (Signed)
FYI to Walden Field, NP.

## 2016-12-01 NOTE — Telephone Encounter (Signed)
I spoke to pt and he was confused because he has a prescription from Dr. [place] Crane for Omeprazole for once a day. Pharmacy called and told him he has a prescription from Dr. Oneida Alar for Bid. He has been taking the Omeprazole bid since he started on the antibiotics. He seems to understand his meds now.

## 2016-12-01 NOTE — Telephone Encounter (Signed)
[phone]  PLEASE CALL PATIENT, SLF PUT HIM ON 2 ANTIBIOTICS AND HE THOUGHT HE WAS ONLY TO TAKE IT FOR 10 DAYS BUT HIS PHARMACY CALLED AND TOLD HIM THEY WERE REFILLED.  HE DOES NOT KNOW WHAT THE MEDICATION IS FOR.

## 2016-12-08 ENCOUNTER — Telehealth: Payer: Self-pay | Admitting: Gastroenterology

## 2016-12-08 NOTE — Telephone Encounter (Signed)
PT said he has completed his antibiotics and wanted to know if he can start back on Zocor and I told him yes, as long as he has completed the the antibiotics.  He is aware to keep appts.

## 2016-12-08 NOTE — Telephone Encounter (Signed)
[phone]  Please call patient he has questions about something he talked with the nurse about regarding his condition

## 2016-12-15 DIAGNOSIS — E1121 Type 2 diabetes mellitus with diabetic nephropathy: Secondary | ICD-10-CM | POA: Diagnosis not present

## 2016-12-15 DIAGNOSIS — K2970 Gastritis, unspecified, without bleeding: Secondary | ICD-10-CM | POA: Diagnosis not present

## 2016-12-15 DIAGNOSIS — I10 Essential (primary) hypertension: Secondary | ICD-10-CM | POA: Diagnosis not present

## 2016-12-15 DIAGNOSIS — G9009 Other idiopathic peripheral autonomic neuropathy: Secondary | ICD-10-CM | POA: Diagnosis not present

## 2016-12-15 DIAGNOSIS — D7289 Other specified disorders of white blood cells: Secondary | ICD-10-CM | POA: Diagnosis not present

## 2016-12-15 DIAGNOSIS — D509 Iron deficiency anemia, unspecified: Secondary | ICD-10-CM | POA: Diagnosis not present

## 2016-12-23 ENCOUNTER — Telehealth: Payer: Self-pay

## 2016-12-23 MED ORDER — OMEPRAZOLE 20 MG PO CPDR: DELAYED_RELEASE_CAPSULE | ORAL | 3 refills | Status: DC

## 2016-12-23 NOTE — Telephone Encounter (Signed)
Received fax from [place] requesting rx for 90 day supply of Omeprazole 20mg  po bid.

## 2016-12-23 NOTE — Telephone Encounter (Signed)
Done

## 2017-03-04 ENCOUNTER — Ambulatory Visit: Payer: Medicare Other | Admitting: Nurse Practitioner

## 2017-03-08 DIAGNOSIS — D509 Iron deficiency anemia, unspecified: Secondary | ICD-10-CM | POA: Diagnosis not present

## 2017-03-08 DIAGNOSIS — E1121 Type 2 diabetes mellitus with diabetic nephropathy: Secondary | ICD-10-CM | POA: Diagnosis not present

## 2017-03-08 DIAGNOSIS — I10 Essential (primary) hypertension: Secondary | ICD-10-CM | POA: Diagnosis not present

## 2017-03-15 DIAGNOSIS — E782 Mixed hyperlipidemia: Secondary | ICD-10-CM | POA: Diagnosis not present

## 2017-03-15 DIAGNOSIS — D509 Iron deficiency anemia, unspecified: Secondary | ICD-10-CM | POA: Diagnosis not present

## 2017-03-15 DIAGNOSIS — Z720 Tobacco use: Secondary | ICD-10-CM | POA: Diagnosis not present

## 2017-03-15 DIAGNOSIS — N183 Chronic kidney disease, stage 3 (moderate): Secondary | ICD-10-CM | POA: Diagnosis not present

## 2017-03-15 DIAGNOSIS — E1121 Type 2 diabetes mellitus with diabetic nephropathy: Secondary | ICD-10-CM | POA: Diagnosis not present

## 2017-03-15 DIAGNOSIS — I10 Essential (primary) hypertension: Secondary | ICD-10-CM | POA: Diagnosis not present

## 2017-03-15 DIAGNOSIS — G9009 Other idiopathic peripheral autonomic neuropathy: Secondary | ICD-10-CM | POA: Diagnosis not present

## 2017-03-15 DIAGNOSIS — Z6820 Body mass index (BMI) 20.0-20.9, adult: Secondary | ICD-10-CM | POA: Diagnosis not present

## 2017-07-13 DIAGNOSIS — I10 Essential (primary) hypertension: Secondary | ICD-10-CM | POA: Diagnosis not present

## 2017-07-13 DIAGNOSIS — E1121 Type 2 diabetes mellitus with diabetic nephropathy: Secondary | ICD-10-CM | POA: Diagnosis not present

## 2017-07-13 DIAGNOSIS — E782 Mixed hyperlipidemia: Secondary | ICD-10-CM | POA: Diagnosis not present

## 2017-07-15 DIAGNOSIS — E1140 Type 2 diabetes mellitus with diabetic neuropathy, unspecified: Secondary | ICD-10-CM | POA: Diagnosis not present

## 2017-07-15 DIAGNOSIS — Z1389 Encounter for screening for other disorder: Secondary | ICD-10-CM | POA: Diagnosis not present

## 2017-07-15 DIAGNOSIS — Z0000 Encounter for general adult medical examination without abnormal findings: Secondary | ICD-10-CM | POA: Diagnosis not present

## 2017-07-15 DIAGNOSIS — Z720 Tobacco use: Secondary | ICD-10-CM | POA: Diagnosis not present

## 2017-07-15 DIAGNOSIS — N182 Chronic kidney disease, stage 2 (mild): Secondary | ICD-10-CM | POA: Diagnosis not present

## 2017-07-15 DIAGNOSIS — E782 Mixed hyperlipidemia: Secondary | ICD-10-CM | POA: Diagnosis not present

## 2017-07-15 DIAGNOSIS — D509 Iron deficiency anemia, unspecified: Secondary | ICD-10-CM | POA: Diagnosis not present

## 2017-07-15 DIAGNOSIS — I10 Essential (primary) hypertension: Secondary | ICD-10-CM | POA: Diagnosis not present

## 2017-07-15 DIAGNOSIS — Z6822 Body mass index (BMI) 22.0-22.9, adult: Secondary | ICD-10-CM | POA: Diagnosis not present

## 2017-10-19 DIAGNOSIS — E782 Mixed hyperlipidemia: Secondary | ICD-10-CM | POA: Diagnosis not present

## 2017-10-19 DIAGNOSIS — I10 Essential (primary) hypertension: Secondary | ICD-10-CM | POA: Diagnosis not present

## 2017-10-19 DIAGNOSIS — E1140 Type 2 diabetes mellitus with diabetic neuropathy, unspecified: Secondary | ICD-10-CM | POA: Diagnosis not present

## 2017-11-18 DIAGNOSIS — D509 Iron deficiency anemia, unspecified: Secondary | ICD-10-CM | POA: Diagnosis not present

## 2017-11-18 DIAGNOSIS — Z6822 Body mass index (BMI) 22.0-22.9, adult: Secondary | ICD-10-CM | POA: Diagnosis not present

## 2017-11-18 DIAGNOSIS — N182 Chronic kidney disease, stage 2 (mild): Secondary | ICD-10-CM | POA: Diagnosis not present

## 2017-11-18 DIAGNOSIS — I10 Essential (primary) hypertension: Secondary | ICD-10-CM | POA: Diagnosis not present

## 2017-11-18 DIAGNOSIS — R809 Proteinuria, unspecified: Secondary | ICD-10-CM | POA: Diagnosis not present

## 2017-11-18 DIAGNOSIS — Z720 Tobacco use: Secondary | ICD-10-CM | POA: Diagnosis not present

## 2017-11-18 DIAGNOSIS — E1140 Type 2 diabetes mellitus with diabetic neuropathy, unspecified: Secondary | ICD-10-CM | POA: Diagnosis not present

## 2017-11-18 DIAGNOSIS — E782 Mixed hyperlipidemia: Secondary | ICD-10-CM | POA: Diagnosis not present

## 2017-12-11 ENCOUNTER — Other Ambulatory Visit: Payer: Self-pay | Admitting: Gastroenterology

## 2018-03-21 DIAGNOSIS — D509 Iron deficiency anemia, unspecified: Secondary | ICD-10-CM | POA: Diagnosis not present

## 2018-03-21 DIAGNOSIS — Z1389 Encounter for screening for other disorder: Secondary | ICD-10-CM | POA: Diagnosis not present

## 2018-03-21 DIAGNOSIS — R809 Proteinuria, unspecified: Secondary | ICD-10-CM | POA: Diagnosis not present

## 2018-03-21 DIAGNOSIS — Z6822 Body mass index (BMI) 22.0-22.9, adult: Secondary | ICD-10-CM | POA: Diagnosis not present

## 2018-03-21 DIAGNOSIS — Z0000 Encounter for general adult medical examination without abnormal findings: Secondary | ICD-10-CM | POA: Diagnosis not present

## 2018-03-21 DIAGNOSIS — N182 Chronic kidney disease, stage 2 (mild): Secondary | ICD-10-CM | POA: Diagnosis not present

## 2018-03-21 DIAGNOSIS — I10 Essential (primary) hypertension: Secondary | ICD-10-CM | POA: Diagnosis not present

## 2018-03-21 DIAGNOSIS — E782 Mixed hyperlipidemia: Secondary | ICD-10-CM | POA: Diagnosis not present

## 2018-03-21 DIAGNOSIS — E1140 Type 2 diabetes mellitus with diabetic neuropathy, unspecified: Secondary | ICD-10-CM | POA: Diagnosis not present

## 2018-03-21 DIAGNOSIS — Z720 Tobacco use: Secondary | ICD-10-CM | POA: Diagnosis not present

## 2018-03-28 DIAGNOSIS — R809 Proteinuria, unspecified: Secondary | ICD-10-CM | POA: Diagnosis not present

## 2018-03-28 DIAGNOSIS — D509 Iron deficiency anemia, unspecified: Secondary | ICD-10-CM | POA: Diagnosis not present

## 2018-03-28 DIAGNOSIS — N182 Chronic kidney disease, stage 2 (mild): Secondary | ICD-10-CM | POA: Diagnosis not present

## 2018-03-28 DIAGNOSIS — E1140 Type 2 diabetes mellitus with diabetic neuropathy, unspecified: Secondary | ICD-10-CM | POA: Diagnosis not present

## 2018-03-28 DIAGNOSIS — Z6822 Body mass index (BMI) 22.0-22.9, adult: Secondary | ICD-10-CM | POA: Diagnosis not present

## 2018-03-28 DIAGNOSIS — I10 Essential (primary) hypertension: Secondary | ICD-10-CM | POA: Diagnosis not present

## 2018-03-28 DIAGNOSIS — E782 Mixed hyperlipidemia: Secondary | ICD-10-CM | POA: Diagnosis not present

## 2018-03-28 DIAGNOSIS — Z712 Person consulting for explanation of examination or test findings: Secondary | ICD-10-CM | POA: Diagnosis not present

## 2018-03-28 DIAGNOSIS — R0602 Shortness of breath: Secondary | ICD-10-CM | POA: Diagnosis not present

## 2018-03-28 DIAGNOSIS — Z720 Tobacco use: Secondary | ICD-10-CM | POA: Diagnosis not present

## 2018-05-09 DIAGNOSIS — R809 Proteinuria, unspecified: Secondary | ICD-10-CM | POA: Diagnosis not present

## 2018-05-09 DIAGNOSIS — Z712 Person consulting for explanation of examination or test findings: Secondary | ICD-10-CM | POA: Diagnosis not present

## 2018-05-09 DIAGNOSIS — E1140 Type 2 diabetes mellitus with diabetic neuropathy, unspecified: Secondary | ICD-10-CM | POA: Diagnosis not present

## 2018-05-09 DIAGNOSIS — N182 Chronic kidney disease, stage 2 (mild): Secondary | ICD-10-CM | POA: Diagnosis not present

## 2018-05-09 DIAGNOSIS — D509 Iron deficiency anemia, unspecified: Secondary | ICD-10-CM | POA: Diagnosis not present

## 2018-05-09 DIAGNOSIS — Z720 Tobacco use: Secondary | ICD-10-CM | POA: Diagnosis not present

## 2018-05-09 DIAGNOSIS — E782 Mixed hyperlipidemia: Secondary | ICD-10-CM | POA: Diagnosis not present

## 2018-05-09 DIAGNOSIS — Z6822 Body mass index (BMI) 22.0-22.9, adult: Secondary | ICD-10-CM | POA: Diagnosis not present

## 2018-05-09 DIAGNOSIS — Z0000 Encounter for general adult medical examination without abnormal findings: Secondary | ICD-10-CM | POA: Diagnosis not present

## 2018-05-09 DIAGNOSIS — Z1389 Encounter for screening for other disorder: Secondary | ICD-10-CM | POA: Diagnosis not present

## 2018-05-09 DIAGNOSIS — R0602 Shortness of breath: Secondary | ICD-10-CM | POA: Diagnosis not present

## 2018-05-09 DIAGNOSIS — I10 Essential (primary) hypertension: Secondary | ICD-10-CM | POA: Diagnosis not present

## 2018-07-17 DIAGNOSIS — Z6822 Body mass index (BMI) 22.0-22.9, adult: Secondary | ICD-10-CM | POA: Diagnosis not present

## 2018-07-17 DIAGNOSIS — D509 Iron deficiency anemia, unspecified: Secondary | ICD-10-CM | POA: Diagnosis not present

## 2018-07-17 DIAGNOSIS — E782 Mixed hyperlipidemia: Secondary | ICD-10-CM | POA: Diagnosis not present

## 2018-07-17 DIAGNOSIS — I10 Essential (primary) hypertension: Secondary | ICD-10-CM | POA: Diagnosis not present

## 2018-07-17 DIAGNOSIS — E1140 Type 2 diabetes mellitus with diabetic neuropathy, unspecified: Secondary | ICD-10-CM | POA: Diagnosis not present

## 2018-07-19 ENCOUNTER — Other Ambulatory Visit (HOSPITAL_COMMUNITY): Payer: Self-pay | Admitting: Respiratory Therapy

## 2018-07-19 DIAGNOSIS — Z720 Tobacco use: Secondary | ICD-10-CM | POA: Diagnosis not present

## 2018-07-19 DIAGNOSIS — E782 Mixed hyperlipidemia: Secondary | ICD-10-CM | POA: Diagnosis not present

## 2018-07-19 DIAGNOSIS — R0602 Shortness of breath: Secondary | ICD-10-CM | POA: Diagnosis not present

## 2018-07-19 DIAGNOSIS — E1140 Type 2 diabetes mellitus with diabetic neuropathy, unspecified: Secondary | ICD-10-CM | POA: Diagnosis not present

## 2018-07-19 DIAGNOSIS — N182 Chronic kidney disease, stage 2 (mild): Secondary | ICD-10-CM | POA: Diagnosis not present

## 2018-07-19 DIAGNOSIS — Z23 Encounter for immunization: Secondary | ICD-10-CM | POA: Diagnosis not present

## 2018-07-19 DIAGNOSIS — D509 Iron deficiency anemia, unspecified: Secondary | ICD-10-CM | POA: Diagnosis not present

## 2018-07-19 DIAGNOSIS — Z0000 Encounter for general adult medical examination without abnormal findings: Secondary | ICD-10-CM | POA: Diagnosis not present

## 2018-07-19 DIAGNOSIS — I10 Essential (primary) hypertension: Secondary | ICD-10-CM | POA: Diagnosis not present

## 2018-07-19 DIAGNOSIS — R809 Proteinuria, unspecified: Secondary | ICD-10-CM | POA: Diagnosis not present

## 2018-10-26 DIAGNOSIS — D509 Iron deficiency anemia, unspecified: Secondary | ICD-10-CM | POA: Diagnosis not present

## 2018-10-26 DIAGNOSIS — E782 Mixed hyperlipidemia: Secondary | ICD-10-CM | POA: Diagnosis not present

## 2018-10-26 DIAGNOSIS — E1140 Type 2 diabetes mellitus with diabetic neuropathy, unspecified: Secondary | ICD-10-CM | POA: Diagnosis not present

## 2018-10-26 DIAGNOSIS — I10 Essential (primary) hypertension: Secondary | ICD-10-CM | POA: Diagnosis not present

## 2018-11-09 DIAGNOSIS — E1140 Type 2 diabetes mellitus with diabetic neuropathy, unspecified: Secondary | ICD-10-CM | POA: Diagnosis not present

## 2018-11-09 DIAGNOSIS — E782 Mixed hyperlipidemia: Secondary | ICD-10-CM | POA: Diagnosis not present

## 2018-11-09 DIAGNOSIS — D509 Iron deficiency anemia, unspecified: Secondary | ICD-10-CM | POA: Diagnosis not present

## 2018-11-09 DIAGNOSIS — Z0000 Encounter for general adult medical examination without abnormal findings: Secondary | ICD-10-CM | POA: Diagnosis not present

## 2018-11-09 DIAGNOSIS — R0602 Shortness of breath: Secondary | ICD-10-CM | POA: Diagnosis not present

## 2018-11-09 DIAGNOSIS — Z23 Encounter for immunization: Secondary | ICD-10-CM | POA: Diagnosis not present

## 2018-11-09 DIAGNOSIS — I10 Essential (primary) hypertension: Secondary | ICD-10-CM | POA: Diagnosis not present

## 2018-11-09 DIAGNOSIS — Z720 Tobacco use: Secondary | ICD-10-CM | POA: Diagnosis not present

## 2018-11-09 DIAGNOSIS — R809 Proteinuria, unspecified: Secondary | ICD-10-CM | POA: Diagnosis not present

## 2018-11-09 DIAGNOSIS — N182 Chronic kidney disease, stage 2 (mild): Secondary | ICD-10-CM | POA: Diagnosis not present

## 2019-03-06 ENCOUNTER — Other Ambulatory Visit: Payer: Self-pay

## 2019-03-06 MED ORDER — OMEPRAZOLE 20 MG PO CPDR: DELAYED_RELEASE_CAPSULE | ORAL | 0 refills | Status: DC

## 2019-03-06 NOTE — Telephone Encounter (Signed)
Patient needs ov for further refills. Last seen in early 2018. I have provided him with one 90day supply of omeprazole.

## 2019-03-07 ENCOUNTER — Encounter: Payer: Self-pay | Admitting: Gastroenterology

## 2019-03-07 NOTE — Telephone Encounter (Signed)
Patient scheduled and letter sent  °

## 2019-03-14 DIAGNOSIS — E1140 Type 2 diabetes mellitus with diabetic neuropathy, unspecified: Secondary | ICD-10-CM | POA: Diagnosis not present

## 2019-03-14 DIAGNOSIS — E782 Mixed hyperlipidemia: Secondary | ICD-10-CM | POA: Diagnosis not present

## 2019-03-14 DIAGNOSIS — D509 Iron deficiency anemia, unspecified: Secondary | ICD-10-CM | POA: Diagnosis not present

## 2019-03-14 DIAGNOSIS — I10 Essential (primary) hypertension: Secondary | ICD-10-CM | POA: Diagnosis not present

## 2019-03-21 DIAGNOSIS — Z720 Tobacco use: Secondary | ICD-10-CM | POA: Diagnosis not present

## 2019-03-21 DIAGNOSIS — R809 Proteinuria, unspecified: Secondary | ICD-10-CM | POA: Diagnosis not present

## 2019-03-21 DIAGNOSIS — D509 Iron deficiency anemia, unspecified: Secondary | ICD-10-CM | POA: Diagnosis not present

## 2019-03-21 DIAGNOSIS — E875 Hyperkalemia: Secondary | ICD-10-CM | POA: Diagnosis not present

## 2019-03-21 DIAGNOSIS — E1140 Type 2 diabetes mellitus with diabetic neuropathy, unspecified: Secondary | ICD-10-CM | POA: Diagnosis not present

## 2019-03-21 DIAGNOSIS — I10 Essential (primary) hypertension: Secondary | ICD-10-CM | POA: Diagnosis not present

## 2019-03-21 DIAGNOSIS — E782 Mixed hyperlipidemia: Secondary | ICD-10-CM | POA: Diagnosis not present

## 2019-03-21 DIAGNOSIS — N182 Chronic kidney disease, stage 2 (mild): Secondary | ICD-10-CM | POA: Diagnosis not present

## 2019-03-29 ENCOUNTER — Other Ambulatory Visit: Payer: Self-pay

## 2019-06-01 ENCOUNTER — Other Ambulatory Visit: Payer: Self-pay | Admitting: Gastroenterology

## 2019-06-01 NOTE — Telephone Encounter (Signed)
Patient needs ov for refills. 

## 2019-06-04 ENCOUNTER — Encounter: Payer: Self-pay | Admitting: Gastroenterology

## 2019-06-04 NOTE — Telephone Encounter (Signed)
PATIENT SCHEDULED AND LETTER SENT  °

## 2019-06-21 ENCOUNTER — Ambulatory Visit: Payer: BLUE CROSS/BLUE SHIELD | Admitting: Gastroenterology

## 2019-06-25 DIAGNOSIS — E782 Mixed hyperlipidemia: Secondary | ICD-10-CM | POA: Diagnosis not present

## 2019-06-25 DIAGNOSIS — E875 Hyperkalemia: Secondary | ICD-10-CM | POA: Diagnosis not present

## 2019-06-25 DIAGNOSIS — I10 Essential (primary) hypertension: Secondary | ICD-10-CM | POA: Diagnosis not present

## 2019-06-25 DIAGNOSIS — Z720 Tobacco use: Secondary | ICD-10-CM | POA: Diagnosis not present

## 2019-06-25 DIAGNOSIS — R809 Proteinuria, unspecified: Secondary | ICD-10-CM | POA: Diagnosis not present

## 2019-06-25 DIAGNOSIS — D509 Iron deficiency anemia, unspecified: Secondary | ICD-10-CM | POA: Diagnosis not present

## 2019-06-25 DIAGNOSIS — E1140 Type 2 diabetes mellitus with diabetic neuropathy, unspecified: Secondary | ICD-10-CM | POA: Diagnosis not present

## 2019-06-25 DIAGNOSIS — N182 Chronic kidney disease, stage 2 (mild): Secondary | ICD-10-CM | POA: Diagnosis not present

## 2019-07-01 DIAGNOSIS — Z720 Tobacco use: Secondary | ICD-10-CM | POA: Diagnosis not present

## 2019-07-01 DIAGNOSIS — D509 Iron deficiency anemia, unspecified: Secondary | ICD-10-CM | POA: Diagnosis not present

## 2019-07-01 DIAGNOSIS — R809 Proteinuria, unspecified: Secondary | ICD-10-CM | POA: Diagnosis not present

## 2019-07-01 DIAGNOSIS — N182 Chronic kidney disease, stage 2 (mild): Secondary | ICD-10-CM | POA: Diagnosis not present

## 2019-07-01 DIAGNOSIS — I10 Essential (primary) hypertension: Secondary | ICD-10-CM | POA: Diagnosis not present

## 2019-07-01 DIAGNOSIS — E1140 Type 2 diabetes mellitus with diabetic neuropathy, unspecified: Secondary | ICD-10-CM | POA: Diagnosis not present

## 2019-07-01 DIAGNOSIS — E875 Hyperkalemia: Secondary | ICD-10-CM | POA: Diagnosis not present

## 2019-07-01 DIAGNOSIS — E782 Mixed hyperlipidemia: Secondary | ICD-10-CM | POA: Diagnosis not present

## 2019-07-13 DIAGNOSIS — N182 Chronic kidney disease, stage 2 (mild): Secondary | ICD-10-CM | POA: Diagnosis not present

## 2019-07-13 DIAGNOSIS — I10 Essential (primary) hypertension: Secondary | ICD-10-CM | POA: Diagnosis not present

## 2019-07-13 DIAGNOSIS — E1140 Type 2 diabetes mellitus with diabetic neuropathy, unspecified: Secondary | ICD-10-CM | POA: Diagnosis not present

## 2019-07-13 DIAGNOSIS — D509 Iron deficiency anemia, unspecified: Secondary | ICD-10-CM | POA: Diagnosis not present

## 2019-07-13 DIAGNOSIS — E782 Mixed hyperlipidemia: Secondary | ICD-10-CM | POA: Diagnosis not present

## 2019-07-17 ENCOUNTER — Ambulatory Visit: Payer: PPO | Admitting: Gastroenterology

## 2019-07-19 DIAGNOSIS — E782 Mixed hyperlipidemia: Secondary | ICD-10-CM | POA: Diagnosis not present

## 2019-07-19 DIAGNOSIS — E875 Hyperkalemia: Secondary | ICD-10-CM | POA: Diagnosis not present

## 2019-07-19 DIAGNOSIS — L989 Disorder of the skin and subcutaneous tissue, unspecified: Secondary | ICD-10-CM | POA: Diagnosis not present

## 2019-07-19 DIAGNOSIS — Z720 Tobacco use: Secondary | ICD-10-CM | POA: Diagnosis not present

## 2019-07-19 DIAGNOSIS — N182 Chronic kidney disease, stage 2 (mild): Secondary | ICD-10-CM | POA: Diagnosis not present

## 2019-07-19 DIAGNOSIS — R809 Proteinuria, unspecified: Secondary | ICD-10-CM | POA: Diagnosis not present

## 2019-07-19 DIAGNOSIS — I10 Essential (primary) hypertension: Secondary | ICD-10-CM | POA: Diagnosis not present

## 2019-07-19 DIAGNOSIS — D509 Iron deficiency anemia, unspecified: Secondary | ICD-10-CM | POA: Diagnosis not present

## 2019-07-19 DIAGNOSIS — E1140 Type 2 diabetes mellitus with diabetic neuropathy, unspecified: Secondary | ICD-10-CM | POA: Diagnosis not present

## 2019-07-19 DIAGNOSIS — K219 Gastro-esophageal reflux disease without esophagitis: Secondary | ICD-10-CM | POA: Diagnosis not present

## 2019-08-01 DIAGNOSIS — E875 Hyperkalemia: Secondary | ICD-10-CM | POA: Diagnosis not present

## 2019-08-01 DIAGNOSIS — K219 Gastro-esophageal reflux disease without esophagitis: Secondary | ICD-10-CM | POA: Diagnosis not present

## 2019-08-01 DIAGNOSIS — D509 Iron deficiency anemia, unspecified: Secondary | ICD-10-CM | POA: Diagnosis not present

## 2019-08-01 DIAGNOSIS — Z720 Tobacco use: Secondary | ICD-10-CM | POA: Diagnosis not present

## 2019-08-01 DIAGNOSIS — R809 Proteinuria, unspecified: Secondary | ICD-10-CM | POA: Diagnosis not present

## 2019-08-01 DIAGNOSIS — E1140 Type 2 diabetes mellitus with diabetic neuropathy, unspecified: Secondary | ICD-10-CM | POA: Diagnosis not present

## 2019-08-01 DIAGNOSIS — I10 Essential (primary) hypertension: Secondary | ICD-10-CM | POA: Diagnosis not present

## 2019-08-01 DIAGNOSIS — N182 Chronic kidney disease, stage 2 (mild): Secondary | ICD-10-CM | POA: Diagnosis not present

## 2019-08-01 DIAGNOSIS — I129 Hypertensive chronic kidney disease with stage 1 through stage 4 chronic kidney disease, or unspecified chronic kidney disease: Secondary | ICD-10-CM | POA: Diagnosis not present

## 2019-08-01 DIAGNOSIS — E782 Mixed hyperlipidemia: Secondary | ICD-10-CM | POA: Diagnosis not present

## 2019-08-01 DIAGNOSIS — L989 Disorder of the skin and subcutaneous tissue, unspecified: Secondary | ICD-10-CM | POA: Diagnosis not present

## 2019-11-19 DIAGNOSIS — E782 Mixed hyperlipidemia: Secondary | ICD-10-CM | POA: Diagnosis not present

## 2019-11-19 DIAGNOSIS — E1140 Type 2 diabetes mellitus with diabetic neuropathy, unspecified: Secondary | ICD-10-CM | POA: Diagnosis not present

## 2019-11-19 DIAGNOSIS — D509 Iron deficiency anemia, unspecified: Secondary | ICD-10-CM | POA: Diagnosis not present

## 2019-11-19 DIAGNOSIS — E875 Hyperkalemia: Secondary | ICD-10-CM | POA: Diagnosis not present

## 2019-11-19 DIAGNOSIS — E1122 Type 2 diabetes mellitus with diabetic chronic kidney disease: Secondary | ICD-10-CM | POA: Diagnosis not present

## 2019-11-22 DIAGNOSIS — D509 Iron deficiency anemia, unspecified: Secondary | ICD-10-CM | POA: Diagnosis not present

## 2019-11-22 DIAGNOSIS — E782 Mixed hyperlipidemia: Secondary | ICD-10-CM | POA: Diagnosis not present

## 2019-11-22 DIAGNOSIS — I10 Essential (primary) hypertension: Secondary | ICD-10-CM | POA: Diagnosis not present

## 2019-11-22 DIAGNOSIS — N182 Chronic kidney disease, stage 2 (mild): Secondary | ICD-10-CM | POA: Diagnosis not present

## 2019-11-22 DIAGNOSIS — E1140 Type 2 diabetes mellitus with diabetic neuropathy, unspecified: Secondary | ICD-10-CM | POA: Diagnosis not present

## 2019-12-31 DIAGNOSIS — E1140 Type 2 diabetes mellitus with diabetic neuropathy, unspecified: Secondary | ICD-10-CM | POA: Diagnosis not present

## 2019-12-31 DIAGNOSIS — E785 Hyperlipidemia, unspecified: Secondary | ICD-10-CM | POA: Diagnosis not present

## 2019-12-31 DIAGNOSIS — E1122 Type 2 diabetes mellitus with diabetic chronic kidney disease: Secondary | ICD-10-CM | POA: Diagnosis not present

## 2019-12-31 DIAGNOSIS — N182 Chronic kidney disease, stage 2 (mild): Secondary | ICD-10-CM | POA: Diagnosis not present

## 2019-12-31 DIAGNOSIS — I129 Hypertensive chronic kidney disease with stage 1 through stage 4 chronic kidney disease, or unspecified chronic kidney disease: Secondary | ICD-10-CM | POA: Diagnosis not present

## 2020-01-22 DIAGNOSIS — L989 Disorder of the skin and subcutaneous tissue, unspecified: Secondary | ICD-10-CM | POA: Diagnosis not present

## 2020-01-31 DIAGNOSIS — C44622 Squamous cell carcinoma of skin of right upper limb, including shoulder: Secondary | ICD-10-CM | POA: Diagnosis not present

## 2020-03-17 DIAGNOSIS — D0461 Carcinoma in situ of skin of right upper limb, including shoulder: Secondary | ICD-10-CM | POA: Diagnosis not present

## 2020-03-21 DIAGNOSIS — I10 Essential (primary) hypertension: Secondary | ICD-10-CM | POA: Diagnosis not present

## 2020-03-21 DIAGNOSIS — E1140 Type 2 diabetes mellitus with diabetic neuropathy, unspecified: Secondary | ICD-10-CM | POA: Diagnosis not present

## 2020-03-21 DIAGNOSIS — E785 Hyperlipidemia, unspecified: Secondary | ICD-10-CM | POA: Diagnosis not present

## 2020-03-21 DIAGNOSIS — N182 Chronic kidney disease, stage 2 (mild): Secondary | ICD-10-CM | POA: Diagnosis not present

## 2020-03-21 DIAGNOSIS — D509 Iron deficiency anemia, unspecified: Secondary | ICD-10-CM | POA: Diagnosis not present

## 2020-03-26 DIAGNOSIS — N182 Chronic kidney disease, stage 2 (mild): Secondary | ICD-10-CM | POA: Diagnosis not present

## 2020-03-26 DIAGNOSIS — Z0001 Encounter for general adult medical examination with abnormal findings: Secondary | ICD-10-CM | POA: Diagnosis not present

## 2020-03-26 DIAGNOSIS — E1140 Type 2 diabetes mellitus with diabetic neuropathy, unspecified: Secondary | ICD-10-CM | POA: Diagnosis not present

## 2020-03-26 DIAGNOSIS — I10 Essential (primary) hypertension: Secondary | ICD-10-CM | POA: Diagnosis not present

## 2020-03-26 DIAGNOSIS — D509 Iron deficiency anemia, unspecified: Secondary | ICD-10-CM | POA: Diagnosis not present

## 2020-04-11 DIAGNOSIS — D509 Iron deficiency anemia, unspecified: Secondary | ICD-10-CM | POA: Diagnosis not present

## 2020-04-11 DIAGNOSIS — N182 Chronic kidney disease, stage 2 (mild): Secondary | ICD-10-CM | POA: Diagnosis not present

## 2020-04-11 DIAGNOSIS — E1140 Type 2 diabetes mellitus with diabetic neuropathy, unspecified: Secondary | ICD-10-CM | POA: Diagnosis not present

## 2020-04-11 DIAGNOSIS — I10 Essential (primary) hypertension: Secondary | ICD-10-CM | POA: Diagnosis not present

## 2020-04-11 DIAGNOSIS — E782 Mixed hyperlipidemia: Secondary | ICD-10-CM | POA: Diagnosis not present

## 2020-04-17 DIAGNOSIS — E1140 Type 2 diabetes mellitus with diabetic neuropathy, unspecified: Secondary | ICD-10-CM | POA: Diagnosis not present

## 2020-04-17 DIAGNOSIS — E785 Hyperlipidemia, unspecified: Secondary | ICD-10-CM | POA: Diagnosis not present

## 2020-04-17 DIAGNOSIS — D509 Iron deficiency anemia, unspecified: Secondary | ICD-10-CM | POA: Diagnosis not present

## 2020-04-17 DIAGNOSIS — Z0000 Encounter for general adult medical examination without abnormal findings: Secondary | ICD-10-CM | POA: Diagnosis not present

## 2020-04-17 DIAGNOSIS — E875 Hyperkalemia: Secondary | ICD-10-CM | POA: Diagnosis not present

## 2020-05-15 DIAGNOSIS — I129 Hypertensive chronic kidney disease with stage 1 through stage 4 chronic kidney disease, or unspecified chronic kidney disease: Secondary | ICD-10-CM | POA: Diagnosis not present

## 2020-05-15 DIAGNOSIS — N182 Chronic kidney disease, stage 2 (mild): Secondary | ICD-10-CM | POA: Diagnosis not present

## 2020-05-15 DIAGNOSIS — E782 Mixed hyperlipidemia: Secondary | ICD-10-CM | POA: Diagnosis not present

## 2020-05-15 DIAGNOSIS — E1122 Type 2 diabetes mellitus with diabetic chronic kidney disease: Secondary | ICD-10-CM | POA: Diagnosis not present

## 2020-05-15 DIAGNOSIS — E1140 Type 2 diabetes mellitus with diabetic neuropathy, unspecified: Secondary | ICD-10-CM | POA: Diagnosis not present

## 2020-06-09 DIAGNOSIS — Z08 Encounter for follow-up examination after completed treatment for malignant neoplasm: Secondary | ICD-10-CM | POA: Diagnosis not present

## 2020-06-09 DIAGNOSIS — Z85828 Personal history of other malignant neoplasm of skin: Secondary | ICD-10-CM | POA: Diagnosis not present

## 2020-07-23 DIAGNOSIS — E785 Hyperlipidemia, unspecified: Secondary | ICD-10-CM | POA: Diagnosis not present

## 2020-07-23 DIAGNOSIS — Z0000 Encounter for general adult medical examination without abnormal findings: Secondary | ICD-10-CM | POA: Diagnosis not present

## 2020-07-23 DIAGNOSIS — D509 Iron deficiency anemia, unspecified: Secondary | ICD-10-CM | POA: Diagnosis not present

## 2020-07-23 DIAGNOSIS — E1140 Type 2 diabetes mellitus with diabetic neuropathy, unspecified: Secondary | ICD-10-CM | POA: Diagnosis not present

## 2020-07-23 DIAGNOSIS — E875 Hyperkalemia: Secondary | ICD-10-CM | POA: Diagnosis not present

## 2020-07-29 DIAGNOSIS — N182 Chronic kidney disease, stage 2 (mild): Secondary | ICD-10-CM | POA: Diagnosis not present

## 2020-07-29 DIAGNOSIS — I10 Essential (primary) hypertension: Secondary | ICD-10-CM | POA: Diagnosis not present

## 2020-07-29 DIAGNOSIS — Z0001 Encounter for general adult medical examination with abnormal findings: Secondary | ICD-10-CM | POA: Diagnosis not present

## 2020-07-29 DIAGNOSIS — D509 Iron deficiency anemia, unspecified: Secondary | ICD-10-CM | POA: Diagnosis not present

## 2020-07-29 DIAGNOSIS — E1140 Type 2 diabetes mellitus with diabetic neuropathy, unspecified: Secondary | ICD-10-CM | POA: Diagnosis not present

## 2020-07-29 DIAGNOSIS — Z23 Encounter for immunization: Secondary | ICD-10-CM | POA: Diagnosis not present

## 2020-08-29 DIAGNOSIS — I10 Essential (primary) hypertension: Secondary | ICD-10-CM | POA: Diagnosis not present

## 2020-08-29 DIAGNOSIS — N182 Chronic kidney disease, stage 2 (mild): Secondary | ICD-10-CM | POA: Diagnosis not present

## 2020-08-29 DIAGNOSIS — E782 Mixed hyperlipidemia: Secondary | ICD-10-CM | POA: Diagnosis not present

## 2020-08-29 DIAGNOSIS — E1140 Type 2 diabetes mellitus with diabetic neuropathy, unspecified: Secondary | ICD-10-CM | POA: Diagnosis not present

## 2020-09-24 DIAGNOSIS — E1129 Type 2 diabetes mellitus with other diabetic kidney complication: Secondary | ICD-10-CM | POA: Diagnosis not present

## 2020-09-24 DIAGNOSIS — E872 Acidosis: Secondary | ICD-10-CM | POA: Diagnosis not present

## 2020-09-24 DIAGNOSIS — E1122 Type 2 diabetes mellitus with diabetic chronic kidney disease: Secondary | ICD-10-CM | POA: Diagnosis not present

## 2020-09-24 DIAGNOSIS — R809 Proteinuria, unspecified: Secondary | ICD-10-CM | POA: Diagnosis not present

## 2020-09-24 DIAGNOSIS — N189 Chronic kidney disease, unspecified: Secondary | ICD-10-CM | POA: Diagnosis not present

## 2020-09-24 DIAGNOSIS — Z79899 Other long term (current) drug therapy: Secondary | ICD-10-CM | POA: Diagnosis not present

## 2020-09-24 DIAGNOSIS — I129 Hypertensive chronic kidney disease with stage 1 through stage 4 chronic kidney disease, or unspecified chronic kidney disease: Secondary | ICD-10-CM | POA: Diagnosis not present

## 2020-09-27 DIAGNOSIS — N182 Chronic kidney disease, stage 2 (mild): Secondary | ICD-10-CM | POA: Diagnosis not present

## 2020-09-27 DIAGNOSIS — Z720 Tobacco use: Secondary | ICD-10-CM | POA: Diagnosis not present

## 2020-09-27 DIAGNOSIS — E782 Mixed hyperlipidemia: Secondary | ICD-10-CM | POA: Diagnosis not present

## 2020-09-27 DIAGNOSIS — R809 Proteinuria, unspecified: Secondary | ICD-10-CM | POA: Diagnosis not present

## 2020-09-27 DIAGNOSIS — E875 Hyperkalemia: Secondary | ICD-10-CM | POA: Diagnosis not present

## 2020-09-27 DIAGNOSIS — I10 Essential (primary) hypertension: Secondary | ICD-10-CM | POA: Diagnosis not present

## 2020-09-27 DIAGNOSIS — D509 Iron deficiency anemia, unspecified: Secondary | ICD-10-CM | POA: Diagnosis not present

## 2020-09-27 DIAGNOSIS — E1140 Type 2 diabetes mellitus with diabetic neuropathy, unspecified: Secondary | ICD-10-CM | POA: Diagnosis not present

## 2020-10-02 ENCOUNTER — Other Ambulatory Visit (HOSPITAL_COMMUNITY): Payer: Self-pay | Admitting: Nephrology

## 2020-10-02 ENCOUNTER — Other Ambulatory Visit: Payer: Self-pay | Admitting: Nephrology

## 2020-10-02 DIAGNOSIS — E1122 Type 2 diabetes mellitus with diabetic chronic kidney disease: Secondary | ICD-10-CM

## 2020-10-02 DIAGNOSIS — R809 Proteinuria, unspecified: Secondary | ICD-10-CM

## 2020-10-02 DIAGNOSIS — E872 Acidosis, unspecified: Secondary | ICD-10-CM

## 2020-10-02 DIAGNOSIS — E559 Vitamin D deficiency, unspecified: Secondary | ICD-10-CM | POA: Diagnosis not present

## 2020-10-02 DIAGNOSIS — E1129 Type 2 diabetes mellitus with other diabetic kidney complication: Secondary | ICD-10-CM

## 2020-10-02 DIAGNOSIS — I129 Hypertensive chronic kidney disease with stage 1 through stage 4 chronic kidney disease, or unspecified chronic kidney disease: Secondary | ICD-10-CM | POA: Diagnosis not present

## 2020-10-02 DIAGNOSIS — N189 Chronic kidney disease, unspecified: Secondary | ICD-10-CM | POA: Diagnosis not present

## 2020-10-02 DIAGNOSIS — Z79899 Other long term (current) drug therapy: Secondary | ICD-10-CM | POA: Diagnosis not present

## 2020-10-10 ENCOUNTER — Other Ambulatory Visit: Payer: Self-pay

## 2020-10-10 ENCOUNTER — Ambulatory Visit (HOSPITAL_COMMUNITY)
Admission: RE | Admit: 2020-10-10 | Discharge: 2020-10-10 | Disposition: A | Discharge status: Home / Self Care | Payer: Medicare Other | Source: Ambulatory Visit | Attending: Nephrology | Admitting: Nephrology

## 2020-10-10 DIAGNOSIS — E1122 Type 2 diabetes mellitus with diabetic chronic kidney disease: Secondary | ICD-10-CM | POA: Diagnosis not present

## 2020-10-10 DIAGNOSIS — E872 Acidosis, unspecified: Secondary | ICD-10-CM

## 2020-10-10 DIAGNOSIS — E1129 Type 2 diabetes mellitus with other diabetic kidney complication: Secondary | ICD-10-CM | POA: Insufficient documentation

## 2020-10-10 DIAGNOSIS — N189 Chronic kidney disease, unspecified: Secondary | ICD-10-CM | POA: Diagnosis not present

## 2020-10-10 DIAGNOSIS — R809 Proteinuria, unspecified: Secondary | ICD-10-CM | POA: Insufficient documentation

## 2020-10-10 DIAGNOSIS — E119 Type 2 diabetes mellitus without complications: Secondary | ICD-10-CM | POA: Diagnosis not present

## 2020-10-23 DIAGNOSIS — E211 Secondary hyperparathyroidism, not elsewhere classified: Secondary | ICD-10-CM | POA: Diagnosis not present

## 2020-10-23 DIAGNOSIS — E1129 Type 2 diabetes mellitus with other diabetic kidney complication: Secondary | ICD-10-CM | POA: Diagnosis not present

## 2020-10-23 DIAGNOSIS — I129 Hypertensive chronic kidney disease with stage 1 through stage 4 chronic kidney disease, or unspecified chronic kidney disease: Secondary | ICD-10-CM | POA: Diagnosis not present

## 2020-10-23 DIAGNOSIS — E1122 Type 2 diabetes mellitus with diabetic chronic kidney disease: Secondary | ICD-10-CM | POA: Diagnosis not present

## 2020-10-23 DIAGNOSIS — N189 Chronic kidney disease, unspecified: Secondary | ICD-10-CM | POA: Diagnosis not present

## 2020-10-23 DIAGNOSIS — D638 Anemia in other chronic diseases classified elsewhere: Secondary | ICD-10-CM | POA: Diagnosis not present

## 2020-10-23 DIAGNOSIS — R809 Proteinuria, unspecified: Secondary | ICD-10-CM | POA: Diagnosis not present

## 2020-10-31 DIAGNOSIS — E875 Hyperkalemia: Secondary | ICD-10-CM | POA: Diagnosis not present

## 2020-10-31 DIAGNOSIS — E1140 Type 2 diabetes mellitus with diabetic neuropathy, unspecified: Secondary | ICD-10-CM | POA: Diagnosis not present

## 2020-10-31 DIAGNOSIS — D509 Iron deficiency anemia, unspecified: Secondary | ICD-10-CM | POA: Diagnosis not present

## 2020-10-31 DIAGNOSIS — Z0000 Encounter for general adult medical examination without abnormal findings: Secondary | ICD-10-CM | POA: Diagnosis not present

## 2020-10-31 DIAGNOSIS — E785 Hyperlipidemia, unspecified: Secondary | ICD-10-CM | POA: Diagnosis not present

## 2020-11-04 DIAGNOSIS — K219 Gastro-esophageal reflux disease without esophagitis: Secondary | ICD-10-CM | POA: Diagnosis not present

## 2020-11-04 DIAGNOSIS — I10 Essential (primary) hypertension: Secondary | ICD-10-CM | POA: Diagnosis not present

## 2020-11-04 DIAGNOSIS — E875 Hyperkalemia: Secondary | ICD-10-CM | POA: Diagnosis not present

## 2020-11-04 DIAGNOSIS — N1831 Chronic kidney disease, stage 3a: Secondary | ICD-10-CM | POA: Diagnosis not present

## 2020-11-04 DIAGNOSIS — L989 Disorder of the skin and subcutaneous tissue, unspecified: Secondary | ICD-10-CM | POA: Diagnosis not present

## 2020-11-04 DIAGNOSIS — E1140 Type 2 diabetes mellitus with diabetic neuropathy, unspecified: Secondary | ICD-10-CM | POA: Diagnosis not present

## 2020-11-04 DIAGNOSIS — D509 Iron deficiency anemia, unspecified: Secondary | ICD-10-CM | POA: Diagnosis not present

## 2020-11-04 DIAGNOSIS — Z720 Tobacco use: Secondary | ICD-10-CM | POA: Diagnosis not present

## 2020-11-04 DIAGNOSIS — R809 Proteinuria, unspecified: Secondary | ICD-10-CM | POA: Diagnosis not present

## 2020-11-04 DIAGNOSIS — E782 Mixed hyperlipidemia: Secondary | ICD-10-CM | POA: Diagnosis not present

## 2020-11-26 DIAGNOSIS — L989 Disorder of the skin and subcutaneous tissue, unspecified: Secondary | ICD-10-CM | POA: Diagnosis not present

## 2020-11-26 DIAGNOSIS — I10 Essential (primary) hypertension: Secondary | ICD-10-CM | POA: Diagnosis not present

## 2020-11-26 DIAGNOSIS — D509 Iron deficiency anemia, unspecified: Secondary | ICD-10-CM | POA: Diagnosis not present

## 2020-11-26 DIAGNOSIS — E782 Mixed hyperlipidemia: Secondary | ICD-10-CM | POA: Diagnosis not present

## 2020-11-26 DIAGNOSIS — Z720 Tobacco use: Secondary | ICD-10-CM | POA: Diagnosis not present

## 2020-11-26 DIAGNOSIS — E875 Hyperkalemia: Secondary | ICD-10-CM | POA: Diagnosis not present

## 2020-11-26 DIAGNOSIS — K219 Gastro-esophageal reflux disease without esophagitis: Secondary | ICD-10-CM | POA: Diagnosis not present

## 2020-12-17 DIAGNOSIS — R809 Proteinuria, unspecified: Secondary | ICD-10-CM | POA: Diagnosis not present

## 2020-12-17 DIAGNOSIS — N189 Chronic kidney disease, unspecified: Secondary | ICD-10-CM | POA: Diagnosis not present

## 2020-12-17 DIAGNOSIS — E1122 Type 2 diabetes mellitus with diabetic chronic kidney disease: Secondary | ICD-10-CM | POA: Diagnosis not present

## 2020-12-17 DIAGNOSIS — E1129 Type 2 diabetes mellitus with other diabetic kidney complication: Secondary | ICD-10-CM | POA: Diagnosis not present

## 2020-12-17 DIAGNOSIS — I129 Hypertensive chronic kidney disease with stage 1 through stage 4 chronic kidney disease, or unspecified chronic kidney disease: Secondary | ICD-10-CM | POA: Diagnosis not present

## 2020-12-28 DIAGNOSIS — I10 Essential (primary) hypertension: Secondary | ICD-10-CM | POA: Diagnosis not present

## 2020-12-28 DIAGNOSIS — K219 Gastro-esophageal reflux disease without esophagitis: Secondary | ICD-10-CM | POA: Diagnosis not present

## 2020-12-31 DIAGNOSIS — D638 Anemia in other chronic diseases classified elsewhere: Secondary | ICD-10-CM | POA: Diagnosis not present

## 2020-12-31 DIAGNOSIS — E1129 Type 2 diabetes mellitus with other diabetic kidney complication: Secondary | ICD-10-CM | POA: Diagnosis not present

## 2020-12-31 DIAGNOSIS — N189 Chronic kidney disease, unspecified: Secondary | ICD-10-CM | POA: Diagnosis not present

## 2020-12-31 DIAGNOSIS — E1122 Type 2 diabetes mellitus with diabetic chronic kidney disease: Secondary | ICD-10-CM | POA: Diagnosis not present

## 2020-12-31 DIAGNOSIS — R809 Proteinuria, unspecified: Secondary | ICD-10-CM | POA: Diagnosis not present

## 2020-12-31 DIAGNOSIS — I129 Hypertensive chronic kidney disease with stage 1 through stage 4 chronic kidney disease, or unspecified chronic kidney disease: Secondary | ICD-10-CM | POA: Diagnosis not present

## 2021-01-28 DIAGNOSIS — K219 Gastro-esophageal reflux disease without esophagitis: Secondary | ICD-10-CM | POA: Diagnosis not present

## 2021-01-28 DIAGNOSIS — E119 Type 2 diabetes mellitus without complications: Secondary | ICD-10-CM | POA: Diagnosis not present

## 2021-03-04 DIAGNOSIS — I10 Essential (primary) hypertension: Secondary | ICD-10-CM | POA: Diagnosis not present

## 2021-03-04 DIAGNOSIS — E119 Type 2 diabetes mellitus without complications: Secondary | ICD-10-CM | POA: Diagnosis not present

## 2021-03-10 DIAGNOSIS — E875 Hyperkalemia: Secondary | ICD-10-CM | POA: Diagnosis not present

## 2021-03-10 DIAGNOSIS — L989 Disorder of the skin and subcutaneous tissue, unspecified: Secondary | ICD-10-CM | POA: Diagnosis not present

## 2021-03-10 DIAGNOSIS — E1122 Type 2 diabetes mellitus with diabetic chronic kidney disease: Secondary | ICD-10-CM | POA: Diagnosis not present

## 2021-03-10 DIAGNOSIS — E1140 Type 2 diabetes mellitus with diabetic neuropathy, unspecified: Secondary | ICD-10-CM | POA: Diagnosis not present

## 2021-03-10 DIAGNOSIS — N1831 Chronic kidney disease, stage 3a: Secondary | ICD-10-CM | POA: Diagnosis not present

## 2021-03-10 DIAGNOSIS — E782 Mixed hyperlipidemia: Secondary | ICD-10-CM | POA: Diagnosis not present

## 2021-03-10 DIAGNOSIS — D509 Iron deficiency anemia, unspecified: Secondary | ICD-10-CM | POA: Diagnosis not present

## 2021-03-10 DIAGNOSIS — K219 Gastro-esophageal reflux disease without esophagitis: Secondary | ICD-10-CM | POA: Diagnosis not present

## 2021-03-10 DIAGNOSIS — I10 Essential (primary) hypertension: Secondary | ICD-10-CM | POA: Diagnosis not present

## 2021-03-10 DIAGNOSIS — F17200 Nicotine dependence, unspecified, uncomplicated: Secondary | ICD-10-CM | POA: Diagnosis not present

## 2021-03-10 DIAGNOSIS — R809 Proteinuria, unspecified: Secondary | ICD-10-CM | POA: Diagnosis not present

## 2021-03-25 DIAGNOSIS — R809 Proteinuria, unspecified: Secondary | ICD-10-CM | POA: Diagnosis not present

## 2021-03-25 DIAGNOSIS — I129 Hypertensive chronic kidney disease with stage 1 through stage 4 chronic kidney disease, or unspecified chronic kidney disease: Secondary | ICD-10-CM | POA: Diagnosis not present

## 2021-03-25 DIAGNOSIS — E1122 Type 2 diabetes mellitus with diabetic chronic kidney disease: Secondary | ICD-10-CM | POA: Diagnosis not present

## 2021-03-25 DIAGNOSIS — N189 Chronic kidney disease, unspecified: Secondary | ICD-10-CM | POA: Diagnosis not present

## 2021-03-25 DIAGNOSIS — E1129 Type 2 diabetes mellitus with other diabetic kidney complication: Secondary | ICD-10-CM | POA: Diagnosis not present

## 2021-03-29 DIAGNOSIS — K219 Gastro-esophageal reflux disease without esophagitis: Secondary | ICD-10-CM | POA: Diagnosis not present

## 2021-03-29 DIAGNOSIS — I10 Essential (primary) hypertension: Secondary | ICD-10-CM | POA: Diagnosis not present

## 2021-04-02 DIAGNOSIS — D638 Anemia in other chronic diseases classified elsewhere: Secondary | ICD-10-CM | POA: Diagnosis not present

## 2021-04-02 DIAGNOSIS — N189 Chronic kidney disease, unspecified: Secondary | ICD-10-CM | POA: Diagnosis not present

## 2021-04-02 DIAGNOSIS — I129 Hypertensive chronic kidney disease with stage 1 through stage 4 chronic kidney disease, or unspecified chronic kidney disease: Secondary | ICD-10-CM | POA: Diagnosis not present

## 2021-04-02 DIAGNOSIS — E1122 Type 2 diabetes mellitus with diabetic chronic kidney disease: Secondary | ICD-10-CM | POA: Diagnosis not present

## 2021-04-02 DIAGNOSIS — E1129 Type 2 diabetes mellitus with other diabetic kidney complication: Secondary | ICD-10-CM | POA: Diagnosis not present

## 2021-04-02 DIAGNOSIS — R809 Proteinuria, unspecified: Secondary | ICD-10-CM | POA: Diagnosis not present

## 2021-04-29 DIAGNOSIS — K219 Gastro-esophageal reflux disease without esophagitis: Secondary | ICD-10-CM | POA: Diagnosis not present

## 2021-04-29 DIAGNOSIS — I10 Essential (primary) hypertension: Secondary | ICD-10-CM | POA: Diagnosis not present

## 2021-06-01 DIAGNOSIS — R809 Proteinuria, unspecified: Secondary | ICD-10-CM | POA: Diagnosis not present

## 2021-06-01 DIAGNOSIS — E1122 Type 2 diabetes mellitus with diabetic chronic kidney disease: Secondary | ICD-10-CM | POA: Diagnosis not present

## 2021-06-01 DIAGNOSIS — I129 Hypertensive chronic kidney disease with stage 1 through stage 4 chronic kidney disease, or unspecified chronic kidney disease: Secondary | ICD-10-CM | POA: Diagnosis not present

## 2021-06-01 DIAGNOSIS — E1129 Type 2 diabetes mellitus with other diabetic kidney complication: Secondary | ICD-10-CM | POA: Diagnosis not present

## 2021-06-01 DIAGNOSIS — N189 Chronic kidney disease, unspecified: Secondary | ICD-10-CM | POA: Diagnosis not present

## 2021-06-05 DIAGNOSIS — D638 Anemia in other chronic diseases classified elsewhere: Secondary | ICD-10-CM | POA: Diagnosis not present

## 2021-06-05 DIAGNOSIS — E1122 Type 2 diabetes mellitus with diabetic chronic kidney disease: Secondary | ICD-10-CM | POA: Diagnosis not present

## 2021-06-05 DIAGNOSIS — E1129 Type 2 diabetes mellitus with other diabetic kidney complication: Secondary | ICD-10-CM | POA: Diagnosis not present

## 2021-06-05 DIAGNOSIS — I129 Hypertensive chronic kidney disease with stage 1 through stage 4 chronic kidney disease, or unspecified chronic kidney disease: Secondary | ICD-10-CM | POA: Diagnosis not present

## 2021-06-05 DIAGNOSIS — R809 Proteinuria, unspecified: Secondary | ICD-10-CM | POA: Diagnosis not present

## 2021-06-05 DIAGNOSIS — N189 Chronic kidney disease, unspecified: Secondary | ICD-10-CM | POA: Diagnosis not present

## 2021-07-01 DIAGNOSIS — Z794 Long term (current) use of insulin: Secondary | ICD-10-CM | POA: Diagnosis not present

## 2021-07-01 DIAGNOSIS — E113293 Type 2 diabetes mellitus with mild nonproliferative diabetic retinopathy without macular edema, bilateral: Secondary | ICD-10-CM | POA: Diagnosis not present

## 2021-07-01 DIAGNOSIS — H35033 Hypertensive retinopathy, bilateral: Secondary | ICD-10-CM | POA: Diagnosis not present

## 2021-07-01 DIAGNOSIS — H524 Presbyopia: Secondary | ICD-10-CM | POA: Diagnosis not present

## 2021-07-01 DIAGNOSIS — I10 Essential (primary) hypertension: Secondary | ICD-10-CM | POA: Diagnosis not present

## 2021-07-09 DIAGNOSIS — E1122 Type 2 diabetes mellitus with diabetic chronic kidney disease: Secondary | ICD-10-CM | POA: Diagnosis not present

## 2021-07-09 DIAGNOSIS — D509 Iron deficiency anemia, unspecified: Secondary | ICD-10-CM | POA: Diagnosis not present

## 2021-07-09 DIAGNOSIS — E782 Mixed hyperlipidemia: Secondary | ICD-10-CM | POA: Diagnosis not present

## 2021-07-14 DIAGNOSIS — D509 Iron deficiency anemia, unspecified: Secondary | ICD-10-CM | POA: Diagnosis not present

## 2021-07-14 DIAGNOSIS — Z0001 Encounter for general adult medical examination with abnormal findings: Secondary | ICD-10-CM | POA: Diagnosis not present

## 2021-07-14 DIAGNOSIS — L989 Disorder of the skin and subcutaneous tissue, unspecified: Secondary | ICD-10-CM | POA: Diagnosis not present

## 2021-07-14 DIAGNOSIS — E782 Mixed hyperlipidemia: Secondary | ICD-10-CM | POA: Diagnosis not present

## 2021-07-14 DIAGNOSIS — N1831 Chronic kidney disease, stage 3a: Secondary | ICD-10-CM | POA: Diagnosis not present

## 2021-07-14 DIAGNOSIS — E1122 Type 2 diabetes mellitus with diabetic chronic kidney disease: Secondary | ICD-10-CM | POA: Diagnosis not present

## 2021-07-14 DIAGNOSIS — E875 Hyperkalemia: Secondary | ICD-10-CM | POA: Diagnosis not present

## 2021-07-14 DIAGNOSIS — I10 Essential (primary) hypertension: Secondary | ICD-10-CM | POA: Diagnosis not present

## 2021-07-14 DIAGNOSIS — R809 Proteinuria, unspecified: Secondary | ICD-10-CM | POA: Diagnosis not present

## 2021-07-14 DIAGNOSIS — F17200 Nicotine dependence, unspecified, uncomplicated: Secondary | ICD-10-CM | POA: Diagnosis not present

## 2021-07-14 DIAGNOSIS — Z23 Encounter for immunization: Secondary | ICD-10-CM | POA: Diagnosis not present

## 2021-07-14 DIAGNOSIS — K219 Gastro-esophageal reflux disease without esophagitis: Secondary | ICD-10-CM | POA: Diagnosis not present

## 2021-08-28 DIAGNOSIS — I10 Essential (primary) hypertension: Secondary | ICD-10-CM | POA: Diagnosis not present

## 2021-08-28 DIAGNOSIS — E782 Mixed hyperlipidemia: Secondary | ICD-10-CM | POA: Diagnosis not present

## 2021-09-03 DIAGNOSIS — E1122 Type 2 diabetes mellitus with diabetic chronic kidney disease: Secondary | ICD-10-CM | POA: Diagnosis not present

## 2021-09-03 DIAGNOSIS — E559 Vitamin D deficiency, unspecified: Secondary | ICD-10-CM | POA: Diagnosis not present

## 2021-09-03 DIAGNOSIS — E1129 Type 2 diabetes mellitus with other diabetic kidney complication: Secondary | ICD-10-CM | POA: Diagnosis not present

## 2021-09-03 DIAGNOSIS — R809 Proteinuria, unspecified: Secondary | ICD-10-CM | POA: Diagnosis not present

## 2021-09-03 DIAGNOSIS — N189 Chronic kidney disease, unspecified: Secondary | ICD-10-CM | POA: Diagnosis not present

## 2021-09-10 DIAGNOSIS — N189 Chronic kidney disease, unspecified: Secondary | ICD-10-CM | POA: Diagnosis not present

## 2021-09-10 DIAGNOSIS — D508 Other iron deficiency anemias: Secondary | ICD-10-CM | POA: Diagnosis not present

## 2021-09-10 DIAGNOSIS — I129 Hypertensive chronic kidney disease with stage 1 through stage 4 chronic kidney disease, or unspecified chronic kidney disease: Secondary | ICD-10-CM | POA: Diagnosis not present

## 2021-09-10 DIAGNOSIS — D638 Anemia in other chronic diseases classified elsewhere: Secondary | ICD-10-CM | POA: Diagnosis not present

## 2021-09-10 DIAGNOSIS — E1122 Type 2 diabetes mellitus with diabetic chronic kidney disease: Secondary | ICD-10-CM | POA: Diagnosis not present

## 2021-09-10 DIAGNOSIS — E1129 Type 2 diabetes mellitus with other diabetic kidney complication: Secondary | ICD-10-CM | POA: Diagnosis not present

## 2021-09-10 DIAGNOSIS — R809 Proteinuria, unspecified: Secondary | ICD-10-CM | POA: Diagnosis not present

## 2021-09-29 DIAGNOSIS — I10 Essential (primary) hypertension: Secondary | ICD-10-CM | POA: Diagnosis not present

## 2021-09-29 DIAGNOSIS — E782 Mixed hyperlipidemia: Secondary | ICD-10-CM | POA: Diagnosis not present

## 2021-11-06 DIAGNOSIS — E782 Mixed hyperlipidemia: Secondary | ICD-10-CM | POA: Diagnosis not present

## 2021-11-06 DIAGNOSIS — D509 Iron deficiency anemia, unspecified: Secondary | ICD-10-CM | POA: Diagnosis not present

## 2021-11-06 DIAGNOSIS — E1122 Type 2 diabetes mellitus with diabetic chronic kidney disease: Secondary | ICD-10-CM | POA: Diagnosis not present

## 2021-11-11 DIAGNOSIS — D509 Iron deficiency anemia, unspecified: Secondary | ICD-10-CM | POA: Diagnosis not present

## 2021-11-11 DIAGNOSIS — K219 Gastro-esophageal reflux disease without esophagitis: Secondary | ICD-10-CM | POA: Diagnosis not present

## 2021-11-11 DIAGNOSIS — E1140 Type 2 diabetes mellitus with diabetic neuropathy, unspecified: Secondary | ICD-10-CM | POA: Diagnosis not present

## 2021-11-11 DIAGNOSIS — F17200 Nicotine dependence, unspecified, uncomplicated: Secondary | ICD-10-CM | POA: Diagnosis not present

## 2021-11-11 DIAGNOSIS — E1122 Type 2 diabetes mellitus with diabetic chronic kidney disease: Secondary | ICD-10-CM | POA: Diagnosis not present

## 2021-11-11 DIAGNOSIS — E782 Mixed hyperlipidemia: Secondary | ICD-10-CM | POA: Diagnosis not present

## 2021-11-11 DIAGNOSIS — I10 Essential (primary) hypertension: Secondary | ICD-10-CM | POA: Diagnosis not present

## 2021-11-11 DIAGNOSIS — L989 Disorder of the skin and subcutaneous tissue, unspecified: Secondary | ICD-10-CM | POA: Diagnosis not present

## 2021-11-11 DIAGNOSIS — N1831 Chronic kidney disease, stage 3a: Secondary | ICD-10-CM | POA: Diagnosis not present

## 2021-11-11 DIAGNOSIS — E875 Hyperkalemia: Secondary | ICD-10-CM | POA: Diagnosis not present

## 2021-11-11 DIAGNOSIS — R809 Proteinuria, unspecified: Secondary | ICD-10-CM | POA: Diagnosis not present

## 2021-11-24 ENCOUNTER — Encounter: Payer: Self-pay | Admitting: *Deleted

## 2021-12-16 DIAGNOSIS — E1129 Type 2 diabetes mellitus with other diabetic kidney complication: Secondary | ICD-10-CM | POA: Diagnosis not present

## 2021-12-16 DIAGNOSIS — D508 Other iron deficiency anemias: Secondary | ICD-10-CM | POA: Diagnosis not present

## 2021-12-16 DIAGNOSIS — N189 Chronic kidney disease, unspecified: Secondary | ICD-10-CM | POA: Diagnosis not present

## 2021-12-16 DIAGNOSIS — R809 Proteinuria, unspecified: Secondary | ICD-10-CM | POA: Diagnosis not present

## 2021-12-16 DIAGNOSIS — E1122 Type 2 diabetes mellitus with diabetic chronic kidney disease: Secondary | ICD-10-CM | POA: Diagnosis not present

## 2021-12-23 DIAGNOSIS — D508 Other iron deficiency anemias: Secondary | ICD-10-CM | POA: Diagnosis not present

## 2021-12-23 DIAGNOSIS — R809 Proteinuria, unspecified: Secondary | ICD-10-CM | POA: Diagnosis not present

## 2021-12-23 DIAGNOSIS — E1129 Type 2 diabetes mellitus with other diabetic kidney complication: Secondary | ICD-10-CM | POA: Diagnosis not present

## 2021-12-23 DIAGNOSIS — N189 Chronic kidney disease, unspecified: Secondary | ICD-10-CM | POA: Diagnosis not present

## 2021-12-23 DIAGNOSIS — I129 Hypertensive chronic kidney disease with stage 1 through stage 4 chronic kidney disease, or unspecified chronic kidney disease: Secondary | ICD-10-CM | POA: Diagnosis not present

## 2021-12-23 DIAGNOSIS — E1122 Type 2 diabetes mellitus with diabetic chronic kidney disease: Secondary | ICD-10-CM | POA: Diagnosis not present

## 2022-01-04 IMAGING — US US RENAL
1 series · 14 of 25 positions shown · non-contrast
Comparison: None.

CLINICAL DATA: Chronic renal disease due to type 2 diabetes.

EXAM:
RENAL / URINARY TRACT ULTRASOUND COMPLETE

[Series 1: us renal · 14 of 51 slices shown]
[im 1/51]
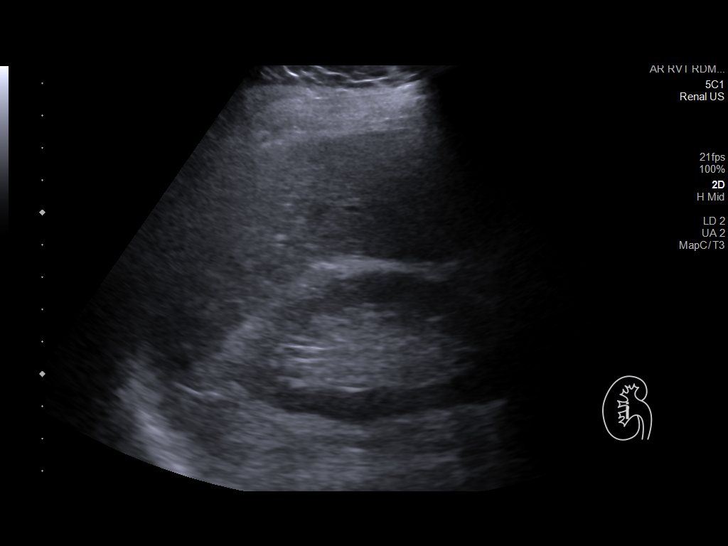
[im 5/51]
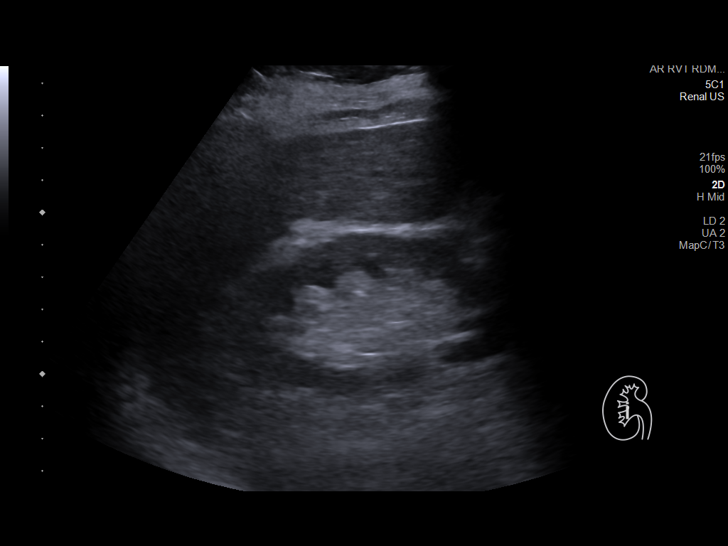
[im 9/51]
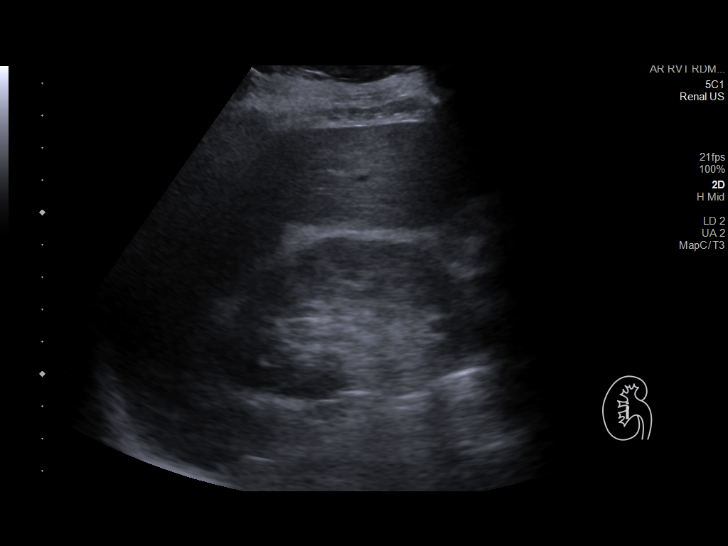
[im 13/51]
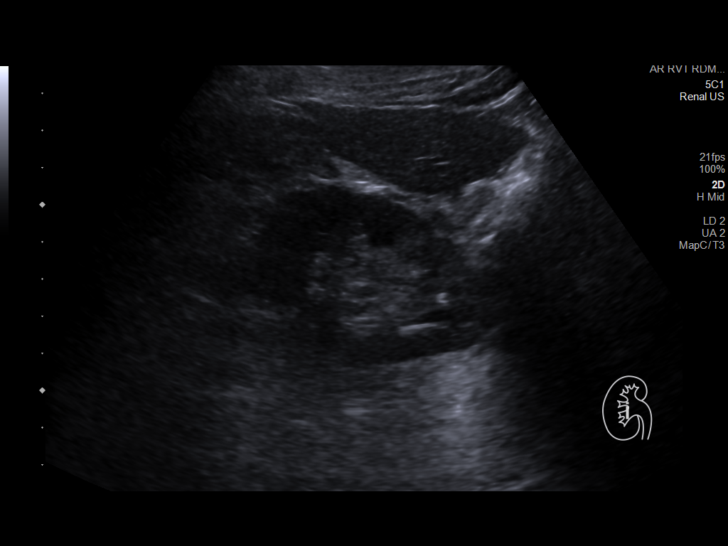
[im 17/51]
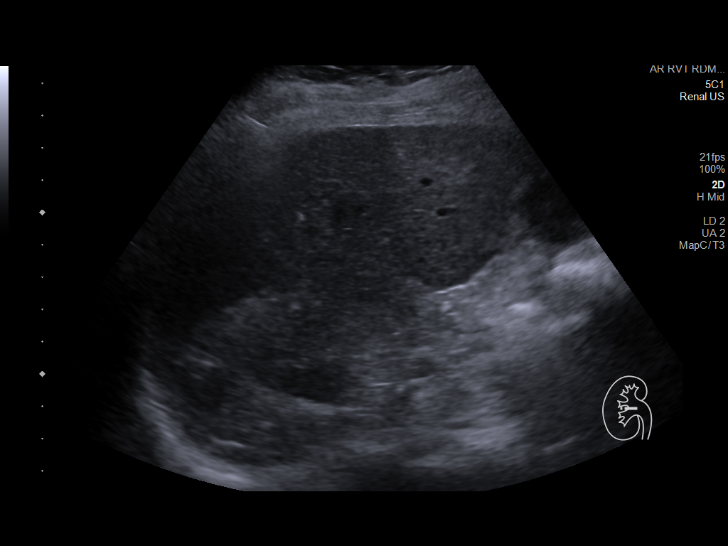
[im 19/51]
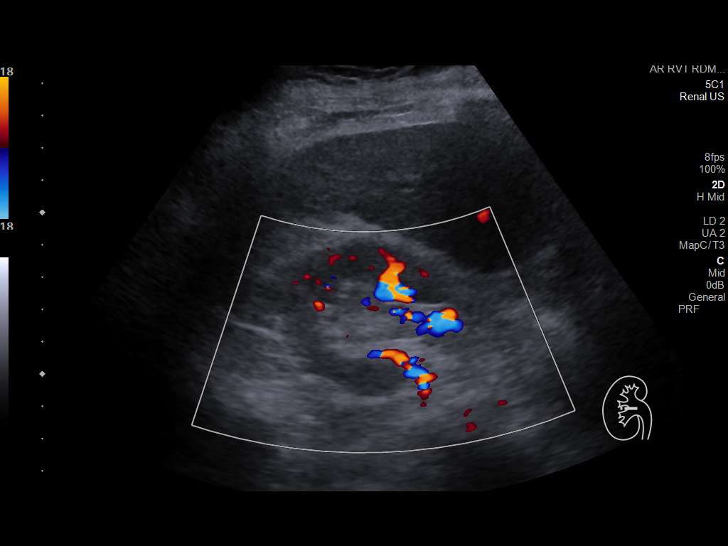
[im 23/51]
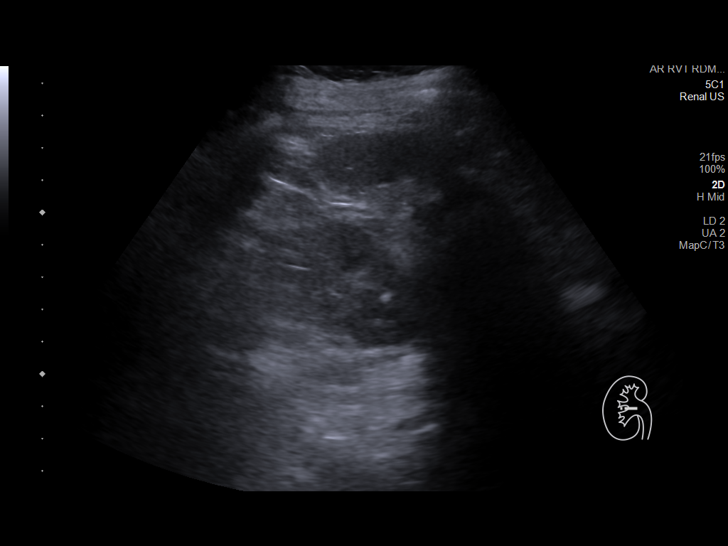
[im 28/51]
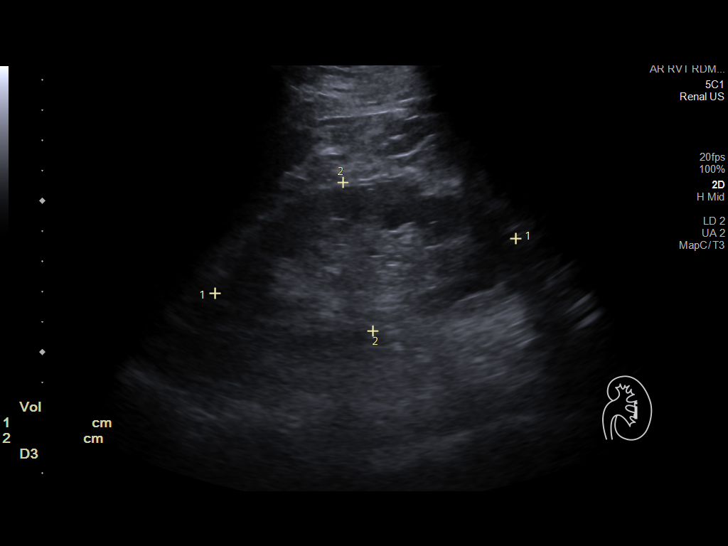
[im 32/51]
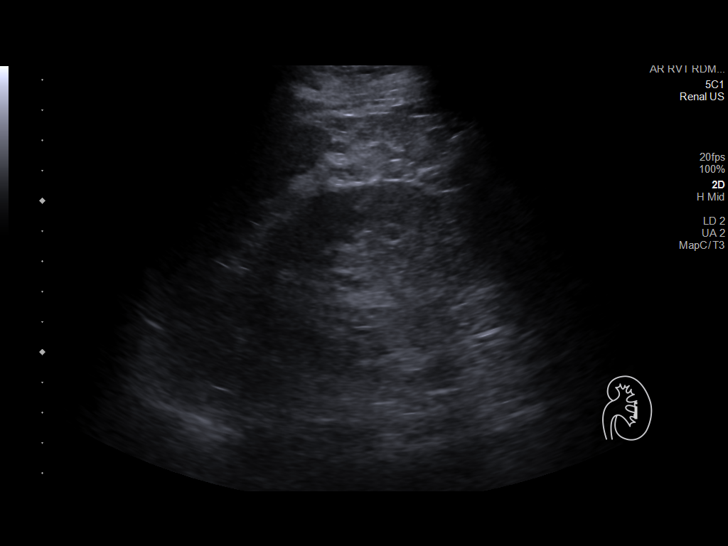
[im 34/51]
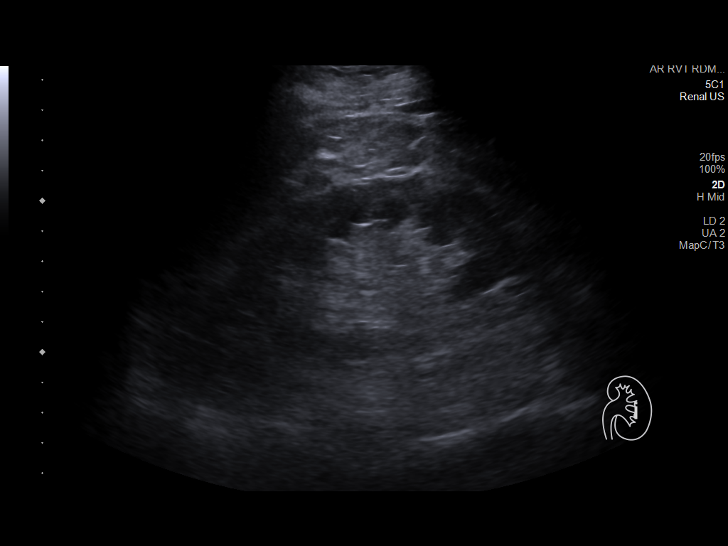
[im 38/51]
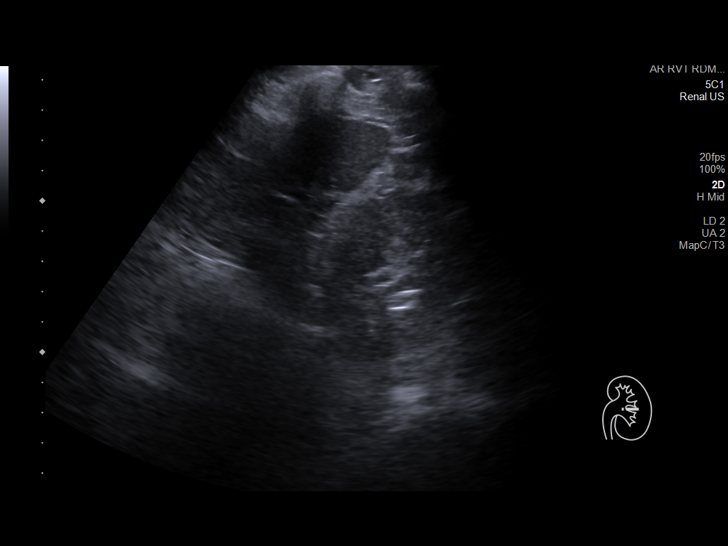
[im 42/51]
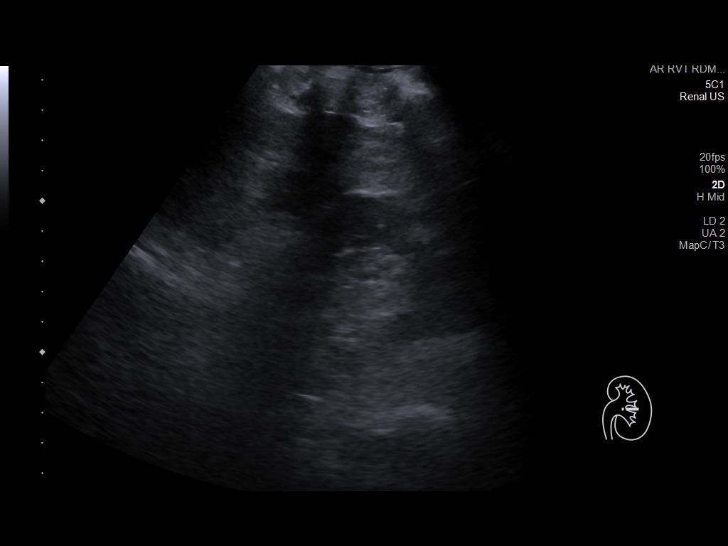
[im 46/51]
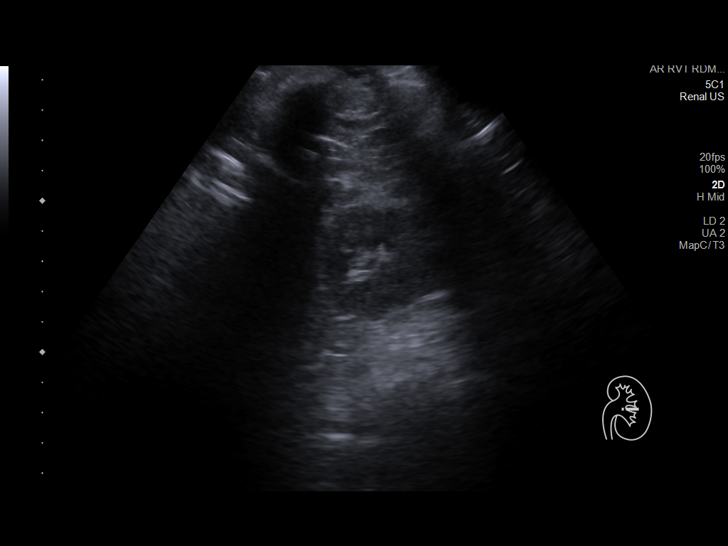
[im 51/51]
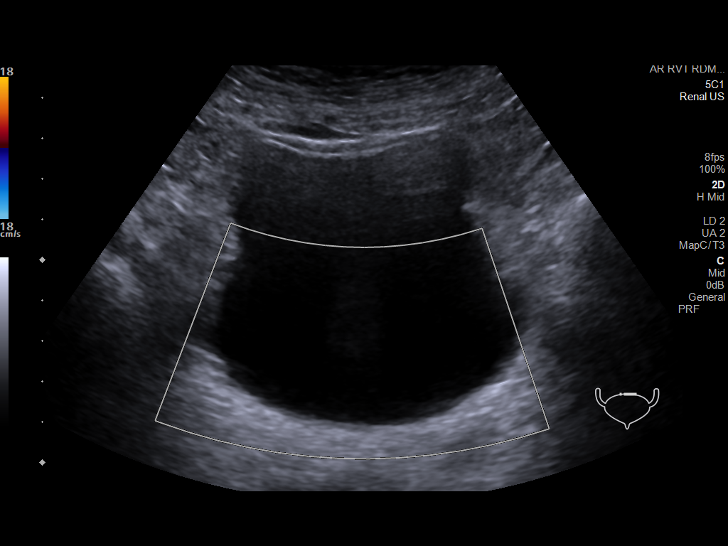

[14 of 25 positions shown; findings below may reference images not displayed]

FINDINGS: Right Kidney:

Renal measurements: 10 x 5.4 x 5 cm = volume: 141 mL. Echogenicity
within normal limits. No definite renal calculus identified. No mass
or hydronephrosis visualized.

Left Kidney:

Renal measurements: 10.1 x 5 x 5.2 cm = volume: 139 mL. Echogenicity
within normal limits. No mass or hydronephrosis visualized.

Urinary bladder:

Appears normal for degree of bladder distention. Bilateral ureteral
jets are identified.

Other:

None.
IMPRESSION: Unremarkable renal ultrasound.

## 2022-02-26 DIAGNOSIS — E782 Mixed hyperlipidemia: Secondary | ICD-10-CM | POA: Diagnosis not present

## 2022-02-26 DIAGNOSIS — N1831 Chronic kidney disease, stage 3a: Secondary | ICD-10-CM | POA: Diagnosis not present

## 2022-02-26 DIAGNOSIS — I129 Hypertensive chronic kidney disease with stage 1 through stage 4 chronic kidney disease, or unspecified chronic kidney disease: Secondary | ICD-10-CM | POA: Diagnosis not present

## 2022-02-26 DIAGNOSIS — E1122 Type 2 diabetes mellitus with diabetic chronic kidney disease: Secondary | ICD-10-CM | POA: Diagnosis not present

## 2022-03-30 DIAGNOSIS — E1122 Type 2 diabetes mellitus with diabetic chronic kidney disease: Secondary | ICD-10-CM | POA: Diagnosis not present

## 2022-03-30 DIAGNOSIS — E782 Mixed hyperlipidemia: Secondary | ICD-10-CM | POA: Diagnosis not present

## 2022-04-06 DIAGNOSIS — D509 Iron deficiency anemia, unspecified: Secondary | ICD-10-CM | POA: Diagnosis not present

## 2022-04-06 DIAGNOSIS — L989 Disorder of the skin and subcutaneous tissue, unspecified: Secondary | ICD-10-CM | POA: Diagnosis not present

## 2022-04-06 DIAGNOSIS — E782 Mixed hyperlipidemia: Secondary | ICD-10-CM | POA: Diagnosis not present

## 2022-04-06 DIAGNOSIS — E875 Hyperkalemia: Secondary | ICD-10-CM | POA: Diagnosis not present

## 2022-04-06 DIAGNOSIS — N1831 Chronic kidney disease, stage 3a: Secondary | ICD-10-CM | POA: Diagnosis not present

## 2022-04-06 DIAGNOSIS — Z6820 Body mass index (BMI) 20.0-20.9, adult: Secondary | ICD-10-CM | POA: Diagnosis not present

## 2022-04-06 DIAGNOSIS — F17200 Nicotine dependence, unspecified, uncomplicated: Secondary | ICD-10-CM | POA: Diagnosis not present

## 2022-04-06 DIAGNOSIS — E1140 Type 2 diabetes mellitus with diabetic neuropathy, unspecified: Secondary | ICD-10-CM | POA: Diagnosis not present

## 2022-04-06 DIAGNOSIS — K219 Gastro-esophageal reflux disease without esophagitis: Secondary | ICD-10-CM | POA: Diagnosis not present

## 2022-04-06 DIAGNOSIS — I10 Essential (primary) hypertension: Secondary | ICD-10-CM | POA: Diagnosis not present

## 2022-04-06 DIAGNOSIS — R809 Proteinuria, unspecified: Secondary | ICD-10-CM | POA: Diagnosis not present

## 2022-04-06 DIAGNOSIS — E1122 Type 2 diabetes mellitus with diabetic chronic kidney disease: Secondary | ICD-10-CM | POA: Diagnosis not present

## 2022-04-14 DIAGNOSIS — E1129 Type 2 diabetes mellitus with other diabetic kidney complication: Secondary | ICD-10-CM | POA: Diagnosis not present

## 2022-04-14 DIAGNOSIS — R809 Proteinuria, unspecified: Secondary | ICD-10-CM | POA: Diagnosis not present

## 2022-04-14 DIAGNOSIS — N189 Chronic kidney disease, unspecified: Secondary | ICD-10-CM | POA: Diagnosis not present

## 2022-04-14 DIAGNOSIS — D508 Other iron deficiency anemias: Secondary | ICD-10-CM | POA: Diagnosis not present

## 2022-04-14 DIAGNOSIS — E1122 Type 2 diabetes mellitus with diabetic chronic kidney disease: Secondary | ICD-10-CM | POA: Diagnosis not present

## 2022-04-22 DIAGNOSIS — E875 Hyperkalemia: Secondary | ICD-10-CM | POA: Diagnosis not present

## 2022-04-22 DIAGNOSIS — I129 Hypertensive chronic kidney disease with stage 1 through stage 4 chronic kidney disease, or unspecified chronic kidney disease: Secondary | ICD-10-CM | POA: Diagnosis not present

## 2022-04-22 DIAGNOSIS — D638 Anemia in other chronic diseases classified elsewhere: Secondary | ICD-10-CM | POA: Diagnosis not present

## 2022-04-22 DIAGNOSIS — N189 Chronic kidney disease, unspecified: Secondary | ICD-10-CM | POA: Diagnosis not present

## 2022-04-22 DIAGNOSIS — D508 Other iron deficiency anemias: Secondary | ICD-10-CM | POA: Diagnosis not present

## 2022-04-22 DIAGNOSIS — E1122 Type 2 diabetes mellitus with diabetic chronic kidney disease: Secondary | ICD-10-CM | POA: Diagnosis not present

## 2022-04-22 DIAGNOSIS — R809 Proteinuria, unspecified: Secondary | ICD-10-CM | POA: Diagnosis not present

## 2022-04-22 DIAGNOSIS — E1129 Type 2 diabetes mellitus with other diabetic kidney complication: Secondary | ICD-10-CM | POA: Diagnosis not present

## 2022-05-04 DIAGNOSIS — N189 Chronic kidney disease, unspecified: Secondary | ICD-10-CM | POA: Diagnosis not present

## 2022-05-04 DIAGNOSIS — E1122 Type 2 diabetes mellitus with diabetic chronic kidney disease: Secondary | ICD-10-CM | POA: Diagnosis not present

## 2022-05-04 DIAGNOSIS — E1129 Type 2 diabetes mellitus with other diabetic kidney complication: Secondary | ICD-10-CM | POA: Diagnosis not present

## 2022-05-04 DIAGNOSIS — I129 Hypertensive chronic kidney disease with stage 1 through stage 4 chronic kidney disease, or unspecified chronic kidney disease: Secondary | ICD-10-CM | POA: Diagnosis not present

## 2022-05-04 DIAGNOSIS — R809 Proteinuria, unspecified: Secondary | ICD-10-CM | POA: Diagnosis not present

## 2022-07-19 DIAGNOSIS — I129 Hypertensive chronic kidney disease with stage 1 through stage 4 chronic kidney disease, or unspecified chronic kidney disease: Secondary | ICD-10-CM | POA: Diagnosis not present

## 2022-07-19 DIAGNOSIS — N189 Chronic kidney disease, unspecified: Secondary | ICD-10-CM | POA: Diagnosis not present

## 2022-07-19 DIAGNOSIS — R809 Proteinuria, unspecified: Secondary | ICD-10-CM | POA: Diagnosis not present

## 2022-07-19 DIAGNOSIS — E1122 Type 2 diabetes mellitus with diabetic chronic kidney disease: Secondary | ICD-10-CM | POA: Diagnosis not present

## 2022-07-19 DIAGNOSIS — E1129 Type 2 diabetes mellitus with other diabetic kidney complication: Secondary | ICD-10-CM | POA: Diagnosis not present

## 2022-08-03 DIAGNOSIS — E782 Mixed hyperlipidemia: Secondary | ICD-10-CM | POA: Diagnosis not present

## 2022-08-03 DIAGNOSIS — E1122 Type 2 diabetes mellitus with diabetic chronic kidney disease: Secondary | ICD-10-CM | POA: Diagnosis not present

## 2022-08-09 DIAGNOSIS — E1122 Type 2 diabetes mellitus with diabetic chronic kidney disease: Secondary | ICD-10-CM | POA: Diagnosis not present

## 2022-08-09 DIAGNOSIS — I10 Essential (primary) hypertension: Secondary | ICD-10-CM | POA: Diagnosis not present

## 2022-08-09 DIAGNOSIS — N1831 Chronic kidney disease, stage 3a: Secondary | ICD-10-CM | POA: Diagnosis not present

## 2022-08-09 DIAGNOSIS — K219 Gastro-esophageal reflux disease without esophagitis: Secondary | ICD-10-CM | POA: Diagnosis not present

## 2022-08-09 DIAGNOSIS — E875 Hyperkalemia: Secondary | ICD-10-CM | POA: Diagnosis not present

## 2022-08-09 DIAGNOSIS — E782 Mixed hyperlipidemia: Secondary | ICD-10-CM | POA: Diagnosis not present

## 2022-08-09 DIAGNOSIS — R809 Proteinuria, unspecified: Secondary | ICD-10-CM | POA: Diagnosis not present

## 2022-08-09 DIAGNOSIS — F17200 Nicotine dependence, unspecified, uncomplicated: Secondary | ICD-10-CM | POA: Diagnosis not present

## 2022-08-09 DIAGNOSIS — L989 Disorder of the skin and subcutaneous tissue, unspecified: Secondary | ICD-10-CM | POA: Diagnosis not present

## 2022-08-09 DIAGNOSIS — E1140 Type 2 diabetes mellitus with diabetic neuropathy, unspecified: Secondary | ICD-10-CM | POA: Diagnosis not present

## 2022-08-09 DIAGNOSIS — Z23 Encounter for immunization: Secondary | ICD-10-CM | POA: Diagnosis not present

## 2022-08-09 DIAGNOSIS — D509 Iron deficiency anemia, unspecified: Secondary | ICD-10-CM | POA: Diagnosis not present

## 2022-08-26 DIAGNOSIS — E1122 Type 2 diabetes mellitus with diabetic chronic kidney disease: Secondary | ICD-10-CM | POA: Diagnosis not present

## 2022-08-26 DIAGNOSIS — E875 Hyperkalemia: Secondary | ICD-10-CM | POA: Diagnosis not present

## 2022-08-26 DIAGNOSIS — N189 Chronic kidney disease, unspecified: Secondary | ICD-10-CM | POA: Diagnosis not present

## 2022-08-26 DIAGNOSIS — E1129 Type 2 diabetes mellitus with other diabetic kidney complication: Secondary | ICD-10-CM | POA: Diagnosis not present

## 2022-08-26 DIAGNOSIS — I129 Hypertensive chronic kidney disease with stage 1 through stage 4 chronic kidney disease, or unspecified chronic kidney disease: Secondary | ICD-10-CM | POA: Diagnosis not present

## 2022-08-26 DIAGNOSIS — R809 Proteinuria, unspecified: Secondary | ICD-10-CM | POA: Diagnosis not present

## 2022-11-25 DIAGNOSIS — N189 Chronic kidney disease, unspecified: Secondary | ICD-10-CM | POA: Diagnosis not present

## 2022-11-25 DIAGNOSIS — R809 Proteinuria, unspecified: Secondary | ICD-10-CM | POA: Diagnosis not present

## 2022-11-25 DIAGNOSIS — E875 Hyperkalemia: Secondary | ICD-10-CM | POA: Diagnosis not present

## 2022-11-25 DIAGNOSIS — E1122 Type 2 diabetes mellitus with diabetic chronic kidney disease: Secondary | ICD-10-CM | POA: Diagnosis not present

## 2022-11-25 DIAGNOSIS — E1129 Type 2 diabetes mellitus with other diabetic kidney complication: Secondary | ICD-10-CM | POA: Diagnosis not present

## 2022-12-02 DIAGNOSIS — E1129 Type 2 diabetes mellitus with other diabetic kidney complication: Secondary | ICD-10-CM | POA: Diagnosis not present

## 2022-12-02 DIAGNOSIS — R809 Proteinuria, unspecified: Secondary | ICD-10-CM | POA: Diagnosis not present

## 2022-12-02 DIAGNOSIS — E875 Hyperkalemia: Secondary | ICD-10-CM | POA: Diagnosis not present

## 2022-12-02 DIAGNOSIS — E1122 Type 2 diabetes mellitus with diabetic chronic kidney disease: Secondary | ICD-10-CM | POA: Diagnosis not present

## 2022-12-02 DIAGNOSIS — N189 Chronic kidney disease, unspecified: Secondary | ICD-10-CM | POA: Diagnosis not present

## 2022-12-02 DIAGNOSIS — I129 Hypertensive chronic kidney disease with stage 1 through stage 4 chronic kidney disease, or unspecified chronic kidney disease: Secondary | ICD-10-CM | POA: Diagnosis not present

## 2022-12-03 DIAGNOSIS — E1122 Type 2 diabetes mellitus with diabetic chronic kidney disease: Secondary | ICD-10-CM | POA: Diagnosis not present

## 2022-12-03 DIAGNOSIS — E782 Mixed hyperlipidemia: Secondary | ICD-10-CM | POA: Diagnosis not present

## 2022-12-03 DIAGNOSIS — D509 Iron deficiency anemia, unspecified: Secondary | ICD-10-CM | POA: Diagnosis not present

## 2022-12-08 DIAGNOSIS — Z0000 Encounter for general adult medical examination without abnormal findings: Secondary | ICD-10-CM | POA: Diagnosis not present

## 2022-12-09 DIAGNOSIS — F17210 Nicotine dependence, cigarettes, uncomplicated: Secondary | ICD-10-CM | POA: Diagnosis not present

## 2022-12-09 DIAGNOSIS — E875 Hyperkalemia: Secondary | ICD-10-CM | POA: Diagnosis not present

## 2022-12-09 DIAGNOSIS — I10 Essential (primary) hypertension: Secondary | ICD-10-CM | POA: Diagnosis not present

## 2022-12-09 DIAGNOSIS — L989 Disorder of the skin and subcutaneous tissue, unspecified: Secondary | ICD-10-CM | POA: Diagnosis not present

## 2022-12-09 DIAGNOSIS — F17200 Nicotine dependence, unspecified, uncomplicated: Secondary | ICD-10-CM | POA: Diagnosis not present

## 2022-12-09 DIAGNOSIS — K219 Gastro-esophageal reflux disease without esophagitis: Secondary | ICD-10-CM | POA: Diagnosis not present

## 2022-12-09 DIAGNOSIS — R809 Proteinuria, unspecified: Secondary | ICD-10-CM | POA: Diagnosis not present

## 2022-12-09 DIAGNOSIS — E1142 Type 2 diabetes mellitus with diabetic polyneuropathy: Secondary | ICD-10-CM | POA: Diagnosis not present

## 2022-12-09 DIAGNOSIS — E1122 Type 2 diabetes mellitus with diabetic chronic kidney disease: Secondary | ICD-10-CM | POA: Diagnosis not present

## 2022-12-09 DIAGNOSIS — N1831 Chronic kidney disease, stage 3a: Secondary | ICD-10-CM | POA: Diagnosis not present

## 2022-12-09 DIAGNOSIS — E782 Mixed hyperlipidemia: Secondary | ICD-10-CM | POA: Diagnosis not present

## 2022-12-09 DIAGNOSIS — D509 Iron deficiency anemia, unspecified: Secondary | ICD-10-CM | POA: Diagnosis not present

## 2023-03-25 DIAGNOSIS — N189 Chronic kidney disease, unspecified: Secondary | ICD-10-CM | POA: Diagnosis not present

## 2023-03-25 DIAGNOSIS — D631 Anemia in chronic kidney disease: Secondary | ICD-10-CM | POA: Diagnosis not present

## 2023-03-25 DIAGNOSIS — R809 Proteinuria, unspecified: Secondary | ICD-10-CM | POA: Diagnosis not present

## 2023-03-25 DIAGNOSIS — I129 Hypertensive chronic kidney disease with stage 1 through stage 4 chronic kidney disease, or unspecified chronic kidney disease: Secondary | ICD-10-CM | POA: Diagnosis not present

## 2023-04-06 DIAGNOSIS — N1831 Chronic kidney disease, stage 3a: Secondary | ICD-10-CM | POA: Diagnosis not present

## 2023-04-06 DIAGNOSIS — E1129 Type 2 diabetes mellitus with other diabetic kidney complication: Secondary | ICD-10-CM | POA: Diagnosis not present

## 2023-04-06 DIAGNOSIS — R809 Proteinuria, unspecified: Secondary | ICD-10-CM | POA: Diagnosis not present

## 2023-04-06 DIAGNOSIS — E782 Mixed hyperlipidemia: Secondary | ICD-10-CM | POA: Diagnosis not present

## 2023-04-06 DIAGNOSIS — E1122 Type 2 diabetes mellitus with diabetic chronic kidney disease: Secondary | ICD-10-CM | POA: Diagnosis not present

## 2023-04-11 DIAGNOSIS — L989 Disorder of the skin and subcutaneous tissue, unspecified: Secondary | ICD-10-CM | POA: Diagnosis not present

## 2023-04-11 DIAGNOSIS — F17200 Nicotine dependence, unspecified, uncomplicated: Secondary | ICD-10-CM | POA: Diagnosis not present

## 2023-04-11 DIAGNOSIS — E875 Hyperkalemia: Secondary | ICD-10-CM | POA: Diagnosis not present

## 2023-04-11 DIAGNOSIS — I10 Essential (primary) hypertension: Secondary | ICD-10-CM | POA: Diagnosis not present

## 2023-04-11 DIAGNOSIS — N1831 Chronic kidney disease, stage 3a: Secondary | ICD-10-CM | POA: Diagnosis not present

## 2023-04-11 DIAGNOSIS — F17210 Nicotine dependence, cigarettes, uncomplicated: Secondary | ICD-10-CM | POA: Diagnosis not present

## 2023-04-11 DIAGNOSIS — D509 Iron deficiency anemia, unspecified: Secondary | ICD-10-CM | POA: Diagnosis not present

## 2023-04-11 DIAGNOSIS — E782 Mixed hyperlipidemia: Secondary | ICD-10-CM | POA: Diagnosis not present

## 2023-04-11 DIAGNOSIS — E1142 Type 2 diabetes mellitus with diabetic polyneuropathy: Secondary | ICD-10-CM | POA: Diagnosis not present

## 2023-04-11 DIAGNOSIS — E1122 Type 2 diabetes mellitus with diabetic chronic kidney disease: Secondary | ICD-10-CM | POA: Diagnosis not present

## 2023-04-11 DIAGNOSIS — R809 Proteinuria, unspecified: Secondary | ICD-10-CM | POA: Diagnosis not present

## 2023-04-11 DIAGNOSIS — K219 Gastro-esophageal reflux disease without esophagitis: Secondary | ICD-10-CM | POA: Diagnosis not present

## 2023-05-20 ENCOUNTER — Ambulatory Visit
Admission: EM | Admit: 2023-05-20 | Discharge: 2023-05-20 | Disposition: A | Discharge status: Home / Self Care | Payer: Medicare Other

## 2023-05-20 ENCOUNTER — Other Ambulatory Visit: Payer: Self-pay

## 2023-05-20 ENCOUNTER — Encounter: Payer: Self-pay | Admitting: Emergency Medicine

## 2023-05-20 DIAGNOSIS — S91119A Laceration without foreign body of unspecified toe without damage to nail, initial encounter: Secondary | ICD-10-CM

## 2023-05-20 MED ORDER — MUPIROCIN 2 % EX OINT: 1.0000 | TOPICAL_OINTMENT | Freq: Every day | CUTANEOUS | 0 refills | Status: DC

## 2023-05-20 MED ORDER — CHLORHEXIDINE GLUCONATE 4 % EX SOLN: Freq: Every day | CUTANEOUS | 0 refills | Status: DC | PRN

## 2023-05-20 MED ORDER — CEPHALEXIN 500 MG PO CAPS: 500.0000 mg | ORAL_CAPSULE | Freq: Two times a day (BID) | ORAL | 0 refills | Status: DC

## 2023-05-20 NOTE — ED Triage Notes (Addendum)
Pt family reports pt was trimming toenails yesterday and "clipped toe as well." Pt reports intermittent bleeding ever since from 4th digit on right foot. Denies being on blood thinners.

## 2023-05-20 NOTE — ED Notes (Addendum)
Bacitracin ointment applied, nonadherent pad placed between fourth and fifth digit on right foot, secured with coban. Pt tolerated well.   Site management and infection prevention education reviewed. Pt and pt family verbalized understanding.

## 2023-05-20 NOTE — Discharge Instructions (Signed)
Clean the area at least once a day with Hibiclens and apply mupirocin ointment and a nonstick dressing.  Take the full course of antibiotics.  Follow-up with primary care for a recheck next week.  Follow-up sooner for worsening symptoms including redness, swelling, thick yellow drainage, fevers, chills.

## 2023-05-20 NOTE — ED Provider Notes (Signed)
[facility]    CSN: [id number] Arrival date & time: 05/20/23  1617      History   Chief Complaint Chief Complaint  Patient presents with   Toe Injury    HPI Eric Ochoa is a 82 y.o. male.   Presenting today with a small laceration to the right fourth toe that occurred yesterday.  He states he was trying to clip his toenails are noticed and clipped the skin.  States the bleeding was significant initially and then again today with a dressing change.  Denies decreased range of motion, numbness, tingling.  So far keeping it clean and covered.    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   BRUISE 01/06/2009   DIABETES MELLITUS, TYPE II, CONTROLLED, WITH COMPLICATIONS 09/22/2007   FATIGUE 03/24/2007   HYPERTENSION 12/22/2006   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 Authorizing Provider  cephALEXin (KEFLEX) 500 MG capsule Take 1 capsule (500 mg  total) by mouth 2 (two) times daily. 05/20/23  Yes Particia Nearing, PA-C  chlorhexidine (HIBICLENS) 4 % external liquid Apply topically daily as needed. 05/20/23  Yes Particia Nearing, PA-C  ezetimibe (ZETIA) 10 MG tablet Take 1 tablet by mouth daily. 05/19/23  Yes [provider]  mupirocin ointment (BACTROBAN) 2 % Apply 1 Application topically daily. 05/20/23  Yes Particia Nearing, PA-C  amLODipine (NORVASC) 10 MG tablet Take 10 mg by mouth daily.  10/05/16   [provider]  amoxicillin (AMOXIL) 500 MG tablet 2 PO BID FOR 10 DAYS 11/25/16   Fields, Darleene Cleaver, MD  aspirin EC 81 MG tablet Take 81 mg by mouth daily.    [provider]  Choline Fenofibrate (FENOFIBRIC ACID) 135 MG CPDR Take 135 mg by mouth daily.  10/05/16   [provider]  clarithromycin (BIAXIN) 500 MG tablet 1 PO BID FOR 10 DAYS. 11/25/16   Fields, Sandi L, MD  FARXIGA 10 MG TABS tablet TAKE 1 TABLET BY MOUTH DAILY FOR PROTEIN IN URINE    [provider]  ferrous sulfate 325 (65 FE) MG tablet Take 325 mg by mouth daily with breakfast.    [provider]  gabapentin (NEURONTIN) 800 MG tablet Take 800 mg by mouth at bedtime.  10/05/16   [provider]  losartan (COZAAR) 100 MG tablet Take 100 mg by mouth daily.  09/06/16  [provider]  metFORMIN (GLUCOPHAGE) 1000 MG tablet Take 1,000 mg by mouth daily with breakfast.  10/04/16   [provider]  omeprazole (PRILOSEC) 20 MG capsule TAKE ONE CAPSULE BY MOUTH 30 MINUTES PRIOR TO BREAKFAST AND SUPPER 06/01/19   Tiffany Kocher, PA-C  simvastatin (ZOCOR) 40 MG tablet Take 40 mg by mouth daily.  09/06/16   [provider]    Family History Family History  Problem Relation Age of Onset   Colon cancer Neg Hx     Social History Social History   Tobacco Use   Smoking status: Every Day    Current packs/day: 0.75    Average packs/day: 0.8 packs/day for 30.0 years (22.5 ttl pk-yrs)     Types: Cigarettes   Smokeless tobacco: Never  Substance Use Topics   Alcohol use: No   Drug use: No     Allergies   Patient has no known allergies.   Review of Systems Review of Systems Per HPI  Physical Exam Triage Vital Signs ED Triage Vitals  Encounter Vitals Group     BP 05/20/23 1712 (!) 148/76     Systolic BP Percentile --      Diastolic BP Percentile --      Pulse Rate 05/20/23 1712 65     Resp 05/20/23 1712 20     Temp 05/20/23 1712 98.6 F (37 C)     Temp Source 05/20/23 1712 Oral     SpO2 05/20/23 1712 95 %     Weight --      Height --      Head Circumference --      Peak Flow --      Pain Score 05/20/23 1707 0     Pain Loc --      Pain Education --      Exclude from Growth Chart --    No data found.  Updated Vital Signs BP (!) 148/76 (BP Location: Right Arm)   Pulse 65   Temp 98.6 F (37 C) (Oral)   Resp 20   SpO2 95%   Visual Acuity Right Eye Distance:   Left Eye Distance:   Bilateral Distance:    Right Eye Near:   Left Eye Near:    Bilateral Near:     Physical Exam Vitals and nursing note reviewed.  Constitutional:      Appearance: Normal appearance.  HENT:     Head: Atraumatic.  Eyes:     Extraocular Movements: Extraocular movements intact.     Conjunctiva/sclera: Conjunctivae normal.  Cardiovascular:     Rate and Rhythm: Normal rate.  Pulmonary:     Effort: Pulmonary effort is normal.  Musculoskeletal:        General: Normal range of motion.     Cervical back: Normal range of motion and neck supple.  Skin:    General: Skin is warm.     Comments: Very small V-shaped flap laceration to the distal aspect of the right fourth toe.  The flap is laying flat at rest and no bleeding or drainage at this time.  Neurological:     General: No focal deficit present.     Mental Status: He is oriented to person, place, and time.     Comments: Right lower extremity neurovascularly intact  Psychiatric:        Mood and Affect: Mood normal.         Thought Content: Thought content normal.        Judgment:  Judgment normal.      UC Treatments / Results  Labs (all labs ordered are listed, but only abnormal results are displayed) Labs Reviewed - No data to display  EKG   Radiology No results found.  Procedures Procedures (including critical care time)  Medications Ordered in UC Medications - No data to display  Initial Impression / Assessment and Plan / UC Course  I have reviewed the triage vital signs and the nursing notes.  Pertinent labs & imaging results that were available during my care of the patient were reviewed by me and considered in my medical decision making (see chart for details).     Bleeding well-controlled currently and no need for any closures as the area is very small and well-approximated.  Wound cleaned and dressed today in clinic, because of his history of diabetes and the location of the laceration will place on Keflex to prevent infection and discussed Hibiclens, mupirocin, nonstick dressings.  Follow-up with PCP for wound check next week.  Final Clinical Impressions(s) / UC Diagnoses   Final diagnoses:  Laceration of toe of right foot without foreign body present or damage to nail, unspecified toe, initial encounter     Discharge Instructions      Clean the area at least once a day with Hibiclens and apply mupirocin ointment and a nonstick dressing.  Take the full course of antibiotics.  Follow-up with primary care for a recheck next week.  Follow-up sooner for worsening symptoms including redness, swelling, thick yellow drainage, fevers, chills.    ED Prescriptions     Medication Sig Dispense Auth. Provider   cephALEXin (KEFLEX) 500 MG capsule Take 1 capsule (500 mg total) by mouth 2 (two) times daily. 14 capsule Particia Nearing, [place]   chlorhexidine (HIBICLENS) 4 % external liquid Apply topically daily as needed. 236 mL Particia Nearing, PA-C   mupirocin ointment  (BACTROBAN) 2 % Apply 1 Application topically daily. 22 g Particia Nearing, [place]      PDMP not reviewed this encounter.   Particia Nearing, [place] 05/20/23 1818

## 2023-05-27 DIAGNOSIS — S91114A Laceration without foreign body of right lesser toe(s) without damage to nail, initial encounter: Secondary | ICD-10-CM | POA: Diagnosis not present

## 2023-05-27 DIAGNOSIS — S91119D Laceration without foreign body of unspecified toe without damage to nail, subsequent encounter: Secondary | ICD-10-CM | POA: Diagnosis not present

## 2023-05-27 DIAGNOSIS — Z23 Encounter for immunization: Secondary | ICD-10-CM | POA: Diagnosis not present

## 2023-07-01 ENCOUNTER — Encounter: Payer: Self-pay | Admitting: Podiatry

## 2023-07-01 ENCOUNTER — Ambulatory Visit: Payer: Medicare Other | Admitting: Podiatry

## 2023-07-01 DIAGNOSIS — E1142 Type 2 diabetes mellitus with diabetic polyneuropathy: Secondary | ICD-10-CM | POA: Diagnosis not present

## 2023-07-01 DIAGNOSIS — M79674 Pain in right toe(s): Secondary | ICD-10-CM

## 2023-07-01 DIAGNOSIS — B351 Tinea unguium: Secondary | ICD-10-CM | POA: Insufficient documentation

## 2023-07-01 DIAGNOSIS — M79675 Pain in left toe(s): Secondary | ICD-10-CM | POA: Diagnosis not present

## 2023-07-01 NOTE — Progress Notes (Signed)
This patient presents to the office for evaluation tip of fourth toe right foot.  He self injured himself which led to bleeding and trip to ED.  He injured himself on 9/20  and was treated with bandaging and cephalexin.  He presents to day saying there is no pain or swelling or drainage.  He also has thick great toenails both feet.  General Appearance  Alert, conversant and in no acute stress.  Vascular  Dorsalis pedis and posterior tibial  pulses are  weakly palpable  bilaterally.  Capillary return is within normal limits  bilaterally. Cold feet.  Venous stasis  B/L. bilaterally.  Neurologic  Senn-Weinstein monofilament wire test absent   bilaterally. Muscle power within normal limits bilaterally.  Nails Thick disfigured discolored nails with subungual debris  hallux nails bilaterally. No evidence of bacterial infection or drainage bilaterally.  Orthopedic  No limitations of motion  feet .  No crepitus or effusions noted.  No bony pathology or digital deformities noted.  HAV  B/L.  Skin  normotropic skin with no porokeratosis noted bilaterally.  No signs of infections or ulcers noted.  Healing tip fourth toe with no  infection, redness  or swelling.  Healing skin lesion fourth toe right foot.   Onychomycosis Hallux  B/L.  IE  Debride nails with nail nipper and dremel tool.  Diabetic foot exam reveals weak pulses and absent LOPS.  Patient qualifies for diabetic shoes due to DPN and HAV  B/L.

## 2023-07-18 DIAGNOSIS — N189 Chronic kidney disease, unspecified: Secondary | ICD-10-CM | POA: Diagnosis not present

## 2023-07-18 DIAGNOSIS — N1831 Chronic kidney disease, stage 3a: Secondary | ICD-10-CM | POA: Diagnosis not present

## 2023-07-18 DIAGNOSIS — R809 Proteinuria, unspecified: Secondary | ICD-10-CM | POA: Diagnosis not present

## 2023-07-18 DIAGNOSIS — E1129 Type 2 diabetes mellitus with other diabetic kidney complication: Secondary | ICD-10-CM | POA: Diagnosis not present

## 2023-07-18 DIAGNOSIS — E1122 Type 2 diabetes mellitus with diabetic chronic kidney disease: Secondary | ICD-10-CM | POA: Diagnosis not present

## 2023-07-25 DIAGNOSIS — E1122 Type 2 diabetes mellitus with diabetic chronic kidney disease: Secondary | ICD-10-CM | POA: Diagnosis not present

## 2023-07-25 DIAGNOSIS — R809 Proteinuria, unspecified: Secondary | ICD-10-CM | POA: Diagnosis not present

## 2023-07-25 DIAGNOSIS — E1129 Type 2 diabetes mellitus with other diabetic kidney complication: Secondary | ICD-10-CM | POA: Diagnosis not present

## 2023-07-25 DIAGNOSIS — I129 Hypertensive chronic kidney disease with stage 1 through stage 4 chronic kidney disease, or unspecified chronic kidney disease: Secondary | ICD-10-CM | POA: Diagnosis not present

## 2023-07-26 ENCOUNTER — Ambulatory Visit: Payer: Medicare Other

## 2023-07-26 DIAGNOSIS — B351 Tinea unguium: Secondary | ICD-10-CM

## 2023-07-26 DIAGNOSIS — E1142 Type 2 diabetes mellitus with diabetic polyneuropathy: Secondary | ICD-10-CM

## 2023-07-26 DIAGNOSIS — M2010 Hallux valgus (acquired), unspecified foot: Secondary | ICD-10-CM

## 2023-07-26 NOTE — Progress Notes (Signed)
Patient presents to the office today for diabetic shoe and insole measuring.  Patient was measured with brannock device to determine size and width for 1 pair of extra depth shoes and foam casted for 3 pair of insoles.   Documentation of medical necessity will be sent to patient's treating diabetic doctor to verify and sign.   Patient's diabetic provider: Gabrielle Dare MD   Shoes and insoles will be ordered at that time and patient will be notified for an appointment for fitting when they arrive.   Shoe size (per patient): 8.5 Brannock measurement: 9 Patient shoe selection- Shoe choice:   585  Shoe size ordered: 8.5 MED  Financials signed  Addison Bailey Cped CFo, CFm

## 2023-08-11 DIAGNOSIS — E782 Mixed hyperlipidemia: Secondary | ICD-10-CM | POA: Diagnosis not present

## 2023-08-11 DIAGNOSIS — E1122 Type 2 diabetes mellitus with diabetic chronic kidney disease: Secondary | ICD-10-CM | POA: Diagnosis not present

## 2023-08-17 DIAGNOSIS — N1831 Chronic kidney disease, stage 3a: Secondary | ICD-10-CM | POA: Diagnosis not present

## 2023-08-17 DIAGNOSIS — K219 Gastro-esophageal reflux disease without esophagitis: Secondary | ICD-10-CM | POA: Diagnosis not present

## 2023-08-17 DIAGNOSIS — L989 Disorder of the skin and subcutaneous tissue, unspecified: Secondary | ICD-10-CM | POA: Diagnosis not present

## 2023-08-17 DIAGNOSIS — F17200 Nicotine dependence, unspecified, uncomplicated: Secondary | ICD-10-CM | POA: Diagnosis not present

## 2023-08-17 DIAGNOSIS — I10 Essential (primary) hypertension: Secondary | ICD-10-CM | POA: Diagnosis not present

## 2023-08-17 DIAGNOSIS — D509 Iron deficiency anemia, unspecified: Secondary | ICD-10-CM | POA: Diagnosis not present

## 2023-08-17 DIAGNOSIS — E1122 Type 2 diabetes mellitus with diabetic chronic kidney disease: Secondary | ICD-10-CM | POA: Diagnosis not present

## 2023-08-17 DIAGNOSIS — R809 Proteinuria, unspecified: Secondary | ICD-10-CM | POA: Diagnosis not present

## 2023-08-17 DIAGNOSIS — E782 Mixed hyperlipidemia: Secondary | ICD-10-CM | POA: Diagnosis not present

## 2023-08-17 DIAGNOSIS — E875 Hyperkalemia: Secondary | ICD-10-CM | POA: Diagnosis not present

## 2023-08-17 DIAGNOSIS — E1142 Type 2 diabetes mellitus with diabetic polyneuropathy: Secondary | ICD-10-CM | POA: Diagnosis not present

## 2023-08-17 DIAGNOSIS — Z79899 Other long term (current) drug therapy: Secondary | ICD-10-CM | POA: Diagnosis not present

## 2023-08-30 ENCOUNTER — Telehealth: Payer: Self-pay

## 2023-08-30 NOTE — Telephone Encounter (Signed)
Spoke with patient he is aware the Dr. Margo Aye has not faxed ppw back for shoes he will try and call Dr. To see if he can get it pushed through

## 2023-09-01 ENCOUNTER — Telehealth: Payer: Self-pay | Admitting: Podiatry

## 2023-09-01 NOTE — Telephone Encounter (Signed)
 Pts daughter called and they have seen Dr Shona and was told he called our office to get the diabetic shoe paperwork sent to them. She called them and they did not get the paperwork still. She did provide a fax number [phone] to fax it to. That is not what we have in the safestep system. I printed it for pts daughter and faxed to that number.  She was concerned wanting to know if we had something against them. I apologized and did explain that the number we have been faxing to is not the number she gave me so I have now faxed to the number she provided.

## 2023-09-06 NOTE — Telephone Encounter (Signed)
 Fax went through with [phone] number as well as office fax again to [phone]

## 2023-09-22 ENCOUNTER — Telehealth: Payer: Self-pay

## 2023-09-22 NOTE — Telephone Encounter (Signed)
 Called pt to schedule diabetic shoe pick up

## 2023-10-17 ENCOUNTER — Other Ambulatory Visit: Payer: Medicare Other

## 2023-11-16 ENCOUNTER — Ambulatory Visit (INDEPENDENT_AMBULATORY_CARE_PROVIDER_SITE_OTHER): Payer: Medicare Other

## 2023-11-16 DIAGNOSIS — M2141 Flat foot [pes planus] (acquired), right foot: Secondary | ICD-10-CM

## 2023-11-16 DIAGNOSIS — M2142 Flat foot [pes planus] (acquired), left foot: Secondary | ICD-10-CM | POA: Diagnosis not present

## 2023-11-16 DIAGNOSIS — M2010 Hallux valgus (acquired), unspecified foot: Secondary | ICD-10-CM | POA: Diagnosis not present

## 2023-11-16 DIAGNOSIS — E1142 Type 2 diabetes mellitus with diabetic polyneuropathy: Secondary | ICD-10-CM

## 2023-11-16 NOTE — Progress Notes (Signed)

## 2023-12-08 ENCOUNTER — Ambulatory Visit (INDEPENDENT_AMBULATORY_CARE_PROVIDER_SITE_OTHER): Admitting: Podiatry

## 2023-12-08 ENCOUNTER — Encounter: Payer: Self-pay | Admitting: Podiatry

## 2023-12-08 VITALS — Ht 72.0 in | Wt 156.6 lb

## 2023-12-08 DIAGNOSIS — M79675 Pain in left toe(s): Secondary | ICD-10-CM | POA: Diagnosis not present

## 2023-12-08 DIAGNOSIS — M79674 Pain in right toe(s): Secondary | ICD-10-CM

## 2023-12-08 DIAGNOSIS — E1142 Type 2 diabetes mellitus with diabetic polyneuropathy: Secondary | ICD-10-CM

## 2023-12-08 DIAGNOSIS — B351 Tinea unguium: Secondary | ICD-10-CM | POA: Diagnosis not present

## 2023-12-08 NOTE — Progress Notes (Signed)
 This patient presents to the office for evaluation tip of fourth toe right foot.  He self injured himself which led to bleeding and trip to ED.  He injured himself on 9/20  and was treated with bandaging and cephalexin.  He presents to day saying there is no pain or swelling or drainage.  He also has thick great toenails both feet. Diabetic foot exam requested by daughter.  He presents for nail care.  Patient has received his shoes.  General Appearance  Alert, conversant and in no acute stress.  Vascular  Dorsalis pedis and posterior tibial  pulses are  weakly palpable  bilaterally.  Capillary return is within normal limits  bilaterally. Cold feet.  Venous stasis  B/L. bilaterally.  Neurologic  Senn-Weinstein monofilament wire test absent   bilaterally. Muscle power within normal limits bilaterally.  Nails Thick disfigured discolored nails with subungual debris  hallux nails bilaterally. No evidence of bacterial infection or drainage bilaterally.  Orthopedic  No limitations of motion  feet .  No crepitus or effusions noted.  No bony pathology or digital deformities noted.  HAV  B/L.  Skin  normotropic skin with no porokeratosis noted bilaterally.  No signs of infections or ulcers noted.  Healing tip fourth toe with no  infection, redness  or swelling.  Healing skin lesion fourth toe right foot.   Onychomycosis Hallux  B/L.  ROV  Debride nails with nail nipper and dremel tool.  Diabetic foot exam reveals weak pulses and absent LOPS.    Helane Gunther DPM

## 2023-12-19 DIAGNOSIS — D631 Anemia in chronic kidney disease: Secondary | ICD-10-CM | POA: Diagnosis not present

## 2023-12-19 DIAGNOSIS — N189 Chronic kidney disease, unspecified: Secondary | ICD-10-CM | POA: Diagnosis not present

## 2023-12-19 DIAGNOSIS — R809 Proteinuria, unspecified: Secondary | ICD-10-CM | POA: Diagnosis not present

## 2023-12-27 DIAGNOSIS — D638 Anemia in other chronic diseases classified elsewhere: Secondary | ICD-10-CM | POA: Diagnosis not present

## 2023-12-27 DIAGNOSIS — I129 Hypertensive chronic kidney disease with stage 1 through stage 4 chronic kidney disease, or unspecified chronic kidney disease: Secondary | ICD-10-CM | POA: Diagnosis not present

## 2023-12-27 DIAGNOSIS — N1832 Chronic kidney disease, stage 3b: Secondary | ICD-10-CM | POA: Diagnosis not present

## 2024-01-03 DIAGNOSIS — I129 Hypertensive chronic kidney disease with stage 1 through stage 4 chronic kidney disease, or unspecified chronic kidney disease: Secondary | ICD-10-CM | POA: Diagnosis not present

## 2024-01-03 DIAGNOSIS — E782 Mixed hyperlipidemia: Secondary | ICD-10-CM | POA: Diagnosis not present

## 2024-01-03 DIAGNOSIS — R809 Proteinuria, unspecified: Secondary | ICD-10-CM | POA: Diagnosis not present

## 2024-01-03 DIAGNOSIS — K219 Gastro-esophageal reflux disease without esophagitis: Secondary | ICD-10-CM | POA: Diagnosis not present

## 2024-01-03 DIAGNOSIS — L989 Disorder of the skin and subcutaneous tissue, unspecified: Secondary | ICD-10-CM | POA: Diagnosis not present

## 2024-01-03 DIAGNOSIS — F17200 Nicotine dependence, unspecified, uncomplicated: Secondary | ICD-10-CM | POA: Diagnosis not present

## 2024-01-03 DIAGNOSIS — E875 Hyperkalemia: Secondary | ICD-10-CM | POA: Diagnosis not present

## 2024-01-03 DIAGNOSIS — E1142 Type 2 diabetes mellitus with diabetic polyneuropathy: Secondary | ICD-10-CM | POA: Diagnosis not present

## 2024-01-03 DIAGNOSIS — N1831 Chronic kidney disease, stage 3a: Secondary | ICD-10-CM | POA: Diagnosis not present

## 2024-01-03 DIAGNOSIS — R634 Abnormal weight loss: Secondary | ICD-10-CM | POA: Diagnosis not present

## 2024-01-03 DIAGNOSIS — D509 Iron deficiency anemia, unspecified: Secondary | ICD-10-CM | POA: Diagnosis not present

## 2024-01-03 DIAGNOSIS — E1122 Type 2 diabetes mellitus with diabetic chronic kidney disease: Secondary | ICD-10-CM | POA: Diagnosis not present

## 2024-01-04 ENCOUNTER — Other Ambulatory Visit (HOSPITAL_COMMUNITY): Payer: Self-pay | Admitting: Internal Medicine

## 2024-01-04 DIAGNOSIS — F17200 Nicotine dependence, unspecified, uncomplicated: Secondary | ICD-10-CM

## 2024-02-08 DIAGNOSIS — E1122 Type 2 diabetes mellitus with diabetic chronic kidney disease: Secondary | ICD-10-CM | POA: Diagnosis not present

## 2024-02-08 DIAGNOSIS — D509 Iron deficiency anemia, unspecified: Secondary | ICD-10-CM | POA: Diagnosis not present

## 2024-02-08 DIAGNOSIS — E782 Mixed hyperlipidemia: Secondary | ICD-10-CM | POA: Diagnosis not present

## 2024-02-09 LAB — LAB REPORT - SCANNED
A1c: 5.8
Albumin, Urine POC: 166.6
Creatinine, POC: 57.4 mg/dL
EGFR: 54
Microalb Creat Ratio: 290

## 2024-02-14 DIAGNOSIS — E1122 Type 2 diabetes mellitus with diabetic chronic kidney disease: Secondary | ICD-10-CM | POA: Diagnosis not present

## 2024-02-14 DIAGNOSIS — N1831 Chronic kidney disease, stage 3a: Secondary | ICD-10-CM | POA: Diagnosis not present

## 2024-02-14 DIAGNOSIS — D509 Iron deficiency anemia, unspecified: Secondary | ICD-10-CM | POA: Diagnosis not present

## 2024-02-14 DIAGNOSIS — F17200 Nicotine dependence, unspecified, uncomplicated: Secondary | ICD-10-CM | POA: Diagnosis not present

## 2024-02-14 DIAGNOSIS — E875 Hyperkalemia: Secondary | ICD-10-CM | POA: Diagnosis not present

## 2024-02-14 DIAGNOSIS — R634 Abnormal weight loss: Secondary | ICD-10-CM | POA: Diagnosis not present

## 2024-02-14 DIAGNOSIS — L989 Disorder of the skin and subcutaneous tissue, unspecified: Secondary | ICD-10-CM | POA: Diagnosis not present

## 2024-02-14 DIAGNOSIS — E1142 Type 2 diabetes mellitus with diabetic polyneuropathy: Secondary | ICD-10-CM | POA: Diagnosis not present

## 2024-02-14 DIAGNOSIS — E782 Mixed hyperlipidemia: Secondary | ICD-10-CM | POA: Diagnosis not present

## 2024-02-14 DIAGNOSIS — K219 Gastro-esophageal reflux disease without esophagitis: Secondary | ICD-10-CM | POA: Diagnosis not present

## 2024-02-14 DIAGNOSIS — R809 Proteinuria, unspecified: Secondary | ICD-10-CM | POA: Diagnosis not present

## 2024-02-14 DIAGNOSIS — I10 Essential (primary) hypertension: Secondary | ICD-10-CM | POA: Diagnosis not present

## 2024-02-20 DIAGNOSIS — C44319 Basal cell carcinoma of skin of other parts of face: Secondary | ICD-10-CM | POA: Diagnosis not present

## 2024-02-20 DIAGNOSIS — D0422 Carcinoma in situ of skin of left ear and external auricular canal: Secondary | ICD-10-CM | POA: Diagnosis not present

## 2024-02-20 DIAGNOSIS — C44212 Basal cell carcinoma of skin of right ear and external auricular canal: Secondary | ICD-10-CM | POA: Diagnosis not present

## 2024-03-12 ENCOUNTER — Encounter: Payer: Self-pay | Admitting: Podiatry

## 2024-03-12 ENCOUNTER — Ambulatory Visit: Admitting: Podiatry

## 2024-03-12 DIAGNOSIS — M79674 Pain in right toe(s): Secondary | ICD-10-CM

## 2024-03-12 DIAGNOSIS — E1142 Type 2 diabetes mellitus with diabetic polyneuropathy: Secondary | ICD-10-CM

## 2024-03-12 DIAGNOSIS — B351 Tinea unguium: Secondary | ICD-10-CM

## 2024-03-12 DIAGNOSIS — M79675 Pain in left toe(s): Secondary | ICD-10-CM

## 2024-03-12 NOTE — Progress Notes (Signed)
 This patient presents to the office for treatment of his long thick nails.  Patient is diabetic.  He also has thick great toenails both feet.  He presents for nail care.  Patient has received his shoes.  General Appearance  Alert, conversant and in no acute stress.  Vascular  Dorsalis pedis and posterior tibial  pulses are  weakly palpable  bilaterally.  Capillary return is within normal limits  bilaterally. Cold feet.  Venous stasis  B/L. bilaterally.  Neurologic  Senn-Weinstein monofilament wire test absent   bilaterally. Muscle power within normal limits bilaterally.  Nails Thick disfigured discolored nails with subungual debris  hallux nails bilaterally. No evidence of bacterial infection or drainage bilaterally.  Orthopedic  No limitations of motion  feet .  No crepitus or effusions noted.  No bony pathology or digital deformities noted.  HAV  B/L.  Skin  normotropic skin with no porokeratosis noted bilaterally.  No signs of infections or ulcers noted.  Healing tip fourth toe with no  infection, redness  or swelling.    Onychomycosis Hallux  B/L.  ROV  Debride nails with nail nipper and dremel tool.  Diabetic foot exam reveals weak pulses and absent LOPS.    Cordella Bold DPM

## 2024-03-13 ENCOUNTER — Ambulatory Visit (HOSPITAL_COMMUNITY)
Admission: RE | Admit: 2024-03-13 | Discharge: 2024-03-13 | Disposition: A | Discharge status: Home / Self Care | Source: Ambulatory Visit | Attending: Internal Medicine | Admitting: Internal Medicine

## 2024-03-13 DIAGNOSIS — F17200 Nicotine dependence, unspecified, uncomplicated: Secondary | ICD-10-CM | POA: Insufficient documentation

## 2024-03-13 DIAGNOSIS — J432 Centrilobular emphysema: Secondary | ICD-10-CM | POA: Diagnosis not present

## 2024-03-13 DIAGNOSIS — I700 Atherosclerosis of aorta: Secondary | ICD-10-CM | POA: Diagnosis not present

## 2024-03-13 DIAGNOSIS — R918 Other nonspecific abnormal finding of lung field: Secondary | ICD-10-CM | POA: Diagnosis not present

## 2024-03-21 ENCOUNTER — Inpatient Hospital Stay: Attending: Oncology | Admitting: Oncology

## 2024-03-21 ENCOUNTER — Inpatient Hospital Stay

## 2024-03-21 VITALS — BP 113/52 | HR 68 | Temp 97.7°F | Resp 18 | Ht 69.88 in | Wt 140.9 lb

## 2024-03-21 DIAGNOSIS — F17210 Nicotine dependence, cigarettes, uncomplicated: Secondary | ICD-10-CM | POA: Diagnosis not present

## 2024-03-21 DIAGNOSIS — Z720 Tobacco use: Secondary | ICD-10-CM

## 2024-03-21 DIAGNOSIS — I10 Essential (primary) hypertension: Secondary | ICD-10-CM | POA: Diagnosis not present

## 2024-03-21 DIAGNOSIS — R918 Other nonspecific abnormal finding of lung field: Secondary | ICD-10-CM | POA: Diagnosis not present

## 2024-03-21 NOTE — Patient Instructions (Addendum)
 [facility]  Discharge Instructions  You were seen and examined today by Dr. Davonna. Dr. Davonna is a medical oncologist, meaning that he specializes in the treatment of cancer diagnoses. Dr. Davonna discussed your past medical history, family history of cancers, and the events that led to you being here today.  You were referred to Dr. Davonna due to a lung mass that appears to be lung cancer.  Dr. Davonna has recommended a PET scan and a biopsy. We will refer you to a pulmonologist to do the biopsy. A PET scan is a specialized CT scan that illuminates where there is cancer present in the body, this is needed to accurately stage the cancer.  Follow-up with Dr. Davonna one week after your biopsy.  After your biopsy, Dr. Davonna will discuss the next steps for treatment.  Thank you for choosing [facility] to provide your oncology and hematology care.   To afford each patient quality time with our provider, please arrive at least 15 minutes before your scheduled appointment time. You may need to reschedule your appointment if you arrive late (10 or more minutes). Arriving late affects you and other patients whose appointments are after yours.  Also, if you miss three or more appointments without notifying the office, you may be dismissed from the clinic at the provider's discretion.    Again, thank you for choosing [facility].  Our hope is that these requests will decrease the amount of time that you wait before being seen by our physicians.   If you have a lab appointment with the Cancer Center - please note that after April 8th, all labs will be drawn in the cancer center.  You do not have to check in or register with the main entrance as you have in the past but will complete your check-in at the cancer center.            _____________________________________________________________  Should you have questions after your  visit to [facility], please contact our office at [phone] and follow the prompts.  Our office hours are 8:00 a.m. to 4:30 p.m. Monday - Thursday and 8:00 a.m. to 2:30 p.m. Friday.  Please note that voicemails left after 4:00 p.m. may not be returned until the following business day.  We are closed weekends and all major holidays.  You do have access to a nurse 24-7, just call the main number to the clinic [phone] and do not press any options, hold on the line and a nurse will answer the phone.    For prescription refill requests, have your pharmacy contact our office and allow 72 hours.    Masks are no longer required in the cancer centers. If you would like for your care team to wear a mask while they are taking care of you, please let them know. You may have one support person who is at least 82 years old accompany you for your appointments.

## 2024-03-25 NOTE — Assessment & Plan Note (Signed)
 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  - Discussed the role of PET scan for staging purposes.  Will order one - Patient will need EBUS with biopsy for confirmation of diagnosis.  Will refer for pulmonary medicine. - If lung cancer is confirmed, will obtain MRI of brain to rule out brain metastasis.  Return to clinic 1 week after biopsy to discuss further management

## 2024-03-25 NOTE — Assessment & Plan Note (Addendum)
 Heavy smoking for 70 years, recently reduced. Significant risk factor for lung cancer and COPD.  - Discussed resources to cut down smoking

## 2024-03-25 NOTE — Progress Notes (Signed)
 Hematology-Oncology Clinic Note  Shona Norleen PEDLAR, MD   Reason for Referral: Right upper lobe lung mass  Oncology History: I have reviewed his chart and materials related to his cancer extensively and collaborated history with the patient. Summary of oncologic history is as follows:  Oncology History  Lung mass  03/13/2024 Imaging   IMPRESSION: 1. Spiculated right upper lobe mass with adjacent satellite nodules and architectural distortion measures 5.0 x 3.9 cm, highly suspicious for primary bronchogenic neoplasm. Suggest pulmonary consultation. 2. Prominent mediastinal lymph nodes and right hilar fullness may reflect enlarged lymph nodes versus overlapping vascular structures but not definitively delineated on this noncontrast enhanced examination. 3. Well-circumscribed 6.3 cm mass in the subcutaneous tissues along the right posterior chest wall measures Hounsfield units greater than that of simple fluid. Likely a large sebaceous cysts consider further evaluation by dedicated ultrasound. 4. Bibasilar subpleural reticulations with honeycombing, suggestive of interstitial lung disease.   03/21/2024 Initial Diagnosis   Lung mass       History of Presenting Illness: Eric Ochoa 83 y.o. male is referred by Dr. Shona for right upper lobe lung mass.  He is accompanied by his daughter today.  He has a past medical history of hypertension and hyperlipidemia.  Patient is a chronic smoker and had a CT scan done for lung cancer screening which showed right upper lobe lung mass.  He has no complaints today including shortness of breath or hemoptysis.  He has good appetite but has noticed some weight loss, which his daughter attributes to aging.  He has a significant smoking history, having smoked for approximately 70 years. Previously a chain smoker, consuming up to two packs a day, he has reduced his smoking to less than one pack a day over the past five to six years.  He is a chronic smoker,  does not drink alcohol.  He is currently retired but worked at a [employer] for 28 years.  He lives with his daughter and is pretty functional.  Medical History: Past Medical History:  Diagnosis Date   Abnormal results of kidney function studies    Abnormal weight loss    Disorder of kidney and ureter    Essential hypertension    Hereditary and idiopathic neuropathy    Hypercalcemia    Iron deficiency anemia    Mixed hyperlipidemia    Mononeuritis multiplex    Tobacco use    Type 2 diabetes mellitus (HCC)     Surgical history: Past Surgical History:  Procedure Laterality Date   COLONOSCOPY N/A 11/01/2016   Procedure: COLONOSCOPY;  Surgeon: Margo LITTIE Haddock, MD;  Location: [facility];  Service: Endoscopy;  Laterality: N/A;  1:45pm   ESOPHAGOGASTRODUODENOSCOPY N/A 11/01/2016   Procedure: ESOPHAGOGASTRODUODENOSCOPY (EGD);  Surgeon: Margo LITTIE Haddock, MD;  Location: [facility];  Service: Endoscopy;  Laterality: N/A;   LAPAROSCOPIC APPENDECTOMY N/A 1975     Allergies:  has no known allergies.  Medications:  Current Outpatient Medications  Medication Sig Dispense Refill   amLODipine (NORVASC) 10 MG tablet Take 10 mg by mouth daily.      amoxicillin  (AMOXIL ) 500 MG tablet 2 PO BID FOR 10 DAYS 40 tablet 0   aspirin EC 81 MG tablet Take 81 mg by mouth daily.     atorvastatin (LIPITOR) 40 MG tablet Take 40 mg by mouth daily.     cephALEXin  (KEFLEX ) 500 MG capsule Take 1 capsule (500 mg total) by mouth 2 (two) times daily. 14 capsule 0  chlorhexidine  (HIBICLENS ) 4 % external liquid Apply topically daily as needed. 236 mL 0   Choline Fenofibrate (FENOFIBRIC ACID) 135 MG CPDR Take 135 mg by mouth daily.      clarithromycin  (BIAXIN ) 500 MG tablet 1 PO BID FOR 10 DAYS. 20 tablet 0   ezetimibe (ZETIA) 10 MG tablet Take 1 tablet by mouth daily.     FARXIGA 10 MG TABS tablet TAKE 1 TABLET BY MOUTH DAILY FOR PROTEIN IN URINE     ferrous sulfate 325 (65 FE) MG tablet Take 325 mg by mouth daily  with breakfast.     gabapentin (NEURONTIN) 800 MG tablet Take 800 mg by mouth at bedtime.      Iron-FA-B Cmp-C-Biot-Probiotic (FUSION PLUS) CAPS Take 1 capsule by mouth daily.     losartan (COZAAR) 100 MG tablet Take 100 mg by mouth daily.      metFORMIN (GLUCOPHAGE) 1000 MG tablet Take 1,000 mg by mouth daily with breakfast.      mupirocin  ointment (BACTROBAN ) 2 % Apply 1 Application topically daily. 22 g 0   omeprazole  (PRILOSEC) 20 MG capsule TAKE ONE CAPSULE BY MOUTH 30 MINUTES PRIOR TO BREAKFAST AND SUPPER 180 capsule 0   simvastatin (ZOCOR) 40 MG tablet Take 40 mg by mouth daily.      No current facility-administered medications for this visit.    Review of Systems: Constitutional: Denies fevers, chills or abnormal night sweats Eyes: Denies blurriness of vision, double vision or watery eyes Ears, nose, mouth, throat, and face: Denies mucositis or sore throat Respiratory: Denies cough, dyspnea or wheezes Cardiovascular: Denies palpitation, chest discomfort or lower extremity swelling Gastrointestinal:  Denies nausea, heartburn or change in bowel habits Skin: Denies abnormal skin rashes Lymphatics: Denies new lymphadenopathy or easy bruising Neurological:Denies numbness, tingling or new weaknesses Behavioral/Psych: Mood is stable, no new changes  All other systems were reviewed with the patient and are negative.  Physical Examination: ECOG PERFORMANCE STATUS: 1 - Symptomatic but completely ambulatory  Vitals:   03/21/24 1044  BP: (!) 113/52  Pulse: 68  Resp: 18  Temp: 97.7 F (36.5 C)  SpO2: 99%   Filed Weights   03/21/24 1044  Weight: 140 lb 14 oz (63.9 kg)    GENERAL:alert, no distress and comfortable SKIN: skin color, texture, turgor are normal, no rashes or significant lesions LUNGS: clear to auscultation and percussion with normal breathing effort, occasional wheezes heard bilaterally. HEART: regular rate & rhythm and no murmurs and no lower extremity  edema ABDOMEN:abdomen soft, non-tender and normal bowel sounds Musculoskeletal:no cyanosis of digits and no clubbing  PSYCH: alert & oriented x 3 with fluent speech NEURO: no focal motor/sensory deficits   Laboratory Data: I have reviewed the data as listed Lab Results  Component Value Date   WBC 6.9 02/19/2009   HGB 13.2 02/19/2009   HCT 38.8 (L) 02/19/2009   MCV 81.5 02/19/2009   PLT 179 02/19/2009     Radiographic Studies: I have personally reviewed the radiological images as listed and agreed with the findings in the report.  CT CHEST WO CONTRAST CLINICAL DATA:  Everyday smoker, denies chest complaints. * Tracking Code: BO *  EXAM: CT CHEST WITHOUT CONTRAST  TECHNIQUE: Multidetector CT imaging of the chest was performed following the standard protocol without IV contrast.  RADIATION DOSE REDUCTION: This exam was performed according to the departmental dose-optimization program which includes automated exposure control, adjustment of the mA and/or kV according to patient size and/or use of iterative reconstruction technique.  COMPARISON:  None Available.  FINDINGS: Cardiovascular: Aortic atherosclerosis. Three-vessel coronary artery calcifications/stents. Normal size heart. No significant pericardial effusion/thickening.  Mediastinum/Nodes: Prominent mediastinal lymph nodes for instance a precarinal lymph node measuring 9 mm in short axis on image 66/2.  Right hilar fullness may reflect enlarged lymph nodes versus overlapping vascular structures but not definitively delineated on this noncontrast enhanced examination.  Lungs/Pleura: Spiculated right upper lobe mass with adjacent satellite nodules and architectural distortion measures 5.0 x 3.9 cm on image 55/3. There is some adjacent interlobular septal thickening and ground-glass for instance on image 63/3.  Paraseptal and centrilobular emphysema. Bibasilar subpleural reticulations with honeycombing. Mild  diffuse bronchial wall thickening. Adherent debris in the trachea.  Upper Abdomen: No suspicious adrenal nodule/mass.  Musculoskeletal: Well-circumscribed 6.3 cm mass in the subcutaneous tissues along the right posterior chest wall on image 115/2 measures Hounsfield units greater than that of simple fluid.  Diffuse demineralization of bone. Multilevel degenerative change of the spine.  IMPRESSION: 1. Spiculated right upper lobe mass with adjacent satellite nodules and architectural distortion measures 5.0 x 3.9 cm, highly suspicious for primary bronchogenic neoplasm. Suggest pulmonary consultation. 2. Prominent mediastinal lymph nodes and right hilar fullness may reflect enlarged lymph nodes versus overlapping vascular structures but not definitively delineated on this noncontrast enhanced examination. 3. Well-circumscribed 6.3 cm mass in the subcutaneous tissues along the right posterior chest wall measures Hounsfield units greater than that of simple fluid. Likely a large sebaceous cysts consider further evaluation by dedicated ultrasound. 4. Bibasilar subpleural reticulations with honeycombing, suggestive of interstitial lung disease. 5. Aortic Atherosclerosis (ICD10-I70.0) and Emphysema (ICD10-J43.9).  These results will be called to the ordering clinician or representative by the Radiologist Assistant, and communication documented in the PACS or [employer].  Electronically Signed   By: Reyes Holder M.D.   On: 03/15/2024 16:15    ASSESSMENT & PLAN:  Patient is a 83 y.o. male presenting for right upper lobe lung mass  Assessment & Plan Lung mass Right upper lobe lung mass with lymphadenopathy highly suggests lung cancer, possibly a stage III.  - Discussed the role of PET scan for staging purposes.  Will order one - Patient will need EBUS with biopsy for confirmation of diagnosis.  Will refer for pulmonary medicine. - If lung cancer is confirmed, will obtain  MRI of brain to rule out brain metastasis.  Return to clinic 1 week after biopsy to discuss further management Tobacco use Heavy smoking for 70 years, recently reduced. Significant risk factor for lung cancer and COPD.  - Discussed resources to cut down smoking    Orders Placed This Encounter  Procedures   NM PET Image Initial (PI) Skull Base To Thigh    Standing Status:   Future    Expected Date:   03/22/2024    Expiration Date:   03/21/2025    If indicated for the ordered procedure, I authorize the administration of a radiopharmaceutical per Radiology protocol:   Yes    Preferred imaging location?:   [facility]    Release to patient:   Immediate   Ambulatory referral to Pulmonology    Referral Priority:   Routine    Referral Type:   Consultation    Referral Reason:   Specialty Services Required    Referred to Provider:   Isadora Hose, MD    Requested Specialty:   Pulmonary Disease    Number of Visits Requested:   1    The total time spent in the appointment was  60 minutes encounter with patients including review of chart and various tests results, discussions about plan of care and coordination of care plan   All questions were answered. The patient knows to call the clinic with any problems, questions or concerns. No barriers to learning was detected.  Mickiel Dry, MD 7/27/202511:20 AM

## 2024-03-27 ENCOUNTER — Telehealth: Payer: Self-pay | Admitting: Student in an Organized Health Care Education/Training Program

## 2024-03-27 DIAGNOSIS — F17200 Nicotine dependence, unspecified, uncomplicated: Secondary | ICD-10-CM | POA: Diagnosis not present

## 2024-03-27 DIAGNOSIS — I129 Hypertensive chronic kidney disease with stage 1 through stage 4 chronic kidney disease, or unspecified chronic kidney disease: Secondary | ICD-10-CM | POA: Diagnosis not present

## 2024-03-27 DIAGNOSIS — K219 Gastro-esophageal reflux disease without esophagitis: Secondary | ICD-10-CM | POA: Diagnosis not present

## 2024-03-27 DIAGNOSIS — N1831 Chronic kidney disease, stage 3a: Secondary | ICD-10-CM | POA: Diagnosis not present

## 2024-03-27 DIAGNOSIS — I700 Atherosclerosis of aorta: Secondary | ICD-10-CM | POA: Diagnosis not present

## 2024-03-27 DIAGNOSIS — R911 Solitary pulmonary nodule: Secondary | ICD-10-CM | POA: Diagnosis not present

## 2024-03-27 DIAGNOSIS — E1142 Type 2 diabetes mellitus with diabetic polyneuropathy: Secondary | ICD-10-CM | POA: Diagnosis not present

## 2024-03-27 DIAGNOSIS — E875 Hyperkalemia: Secondary | ICD-10-CM | POA: Diagnosis not present

## 2024-03-27 DIAGNOSIS — J439 Emphysema, unspecified: Secondary | ICD-10-CM | POA: Diagnosis not present

## 2024-03-27 DIAGNOSIS — D509 Iron deficiency anemia, unspecified: Secondary | ICD-10-CM | POA: Diagnosis not present

## 2024-03-27 DIAGNOSIS — R918 Other nonspecific abnormal finding of lung field: Secondary | ICD-10-CM

## 2024-03-27 DIAGNOSIS — R634 Abnormal weight loss: Secondary | ICD-10-CM | POA: Diagnosis not present

## 2024-03-27 NOTE — Telephone Encounter (Signed)
 Please schedule the following:  Provider performing procedure:Allyiah Gartner Diagnosis: Lung mass Which side if for nodule / mass? right Procedure: Robotic Assisted Navigational Bronchoscopy, EBUS  Has patient been spoken to by Provider and given informed consent? yes Anesthesia: general Do you need Fluro? Yes, CIOS Duration of procedure: 1.5 hours Date: 03/29/2024 Location: [facility] Does patient have OSA? no DM? no Or Latex allergy? no Medication Restriction/ Anticoagulate/Antiplatelet: no Pre-op Labs Ordered:determined by Anesthesia Imaging request: yes  (If, SuperDimension CT Chest, please have STAT courier sent to ENDO)

## 2024-03-27 NOTE — Telephone Encounter (Addendum)
 Robotic Bronchoscopy with EBUS  03/29/2024 at 9:15  [facility] Lung Nodule 68372, [id number], [id number]  Eric Ochoa please see bronch info.   Patient is scheduled to see Dr. Isadora tomorrow and I will give him the procedure date and times then.

## 2024-03-28 ENCOUNTER — Ambulatory Visit: Admitting: Student in an Organized Health Care Education/Training Program

## 2024-03-28 ENCOUNTER — Encounter (HOSPITAL_COMMUNITY): Payer: Self-pay | Admitting: Student in an Organized Health Care Education/Training Program

## 2024-03-28 ENCOUNTER — Other Ambulatory Visit: Payer: Self-pay

## 2024-03-28 ENCOUNTER — Encounter: Payer: Self-pay | Admitting: Student in an Organized Health Care Education/Training Program

## 2024-03-28 ENCOUNTER — Encounter (HOSPITAL_COMMUNITY)
Admission: RE | Admit: 2024-03-28 | Discharge: 2024-03-28 | Disposition: A | Discharge status: Home / Self Care | Source: Ambulatory Visit | Attending: Oncology | Admitting: Oncology

## 2024-03-28 VITALS — BP 110/60 | HR 63 | Temp 98.1°F | Ht 72.0 in | Wt 138.2 lb

## 2024-03-28 DIAGNOSIS — R918 Other nonspecific abnormal finding of lung field: Secondary | ICD-10-CM | POA: Insufficient documentation

## 2024-03-28 LAB — GLUCOSE, CAPILLARY: Glucose-Capillary: 87 mg/dL (ref 70–99)

## 2024-03-28 MED ORDER — FLUDEOXYGLUCOSE F - 18 (FDG) INJECTION
6.8000 | Freq: Once | INTRAVENOUS | Status: AC
  Administered 2024-03-28: 6.8 via INTRAVENOUS

## 2024-03-28 NOTE — Progress Notes (Addendum)
 PCP - Shona Norleen PEDLAR, MD  Cardiologist -   PPM/ICD - denies Device Orders - n/a Rep Notified - n/a  Chest x-ray -  Chest CT - 03-15-24 EKG - denies Stress Test - denies ECHO - denies Cardiac Cath - denies  CPAP - denies Dm -Fasting blood sugar per patient between 80-90 Blood sugar checks- 3 times a week  Blood Thinner Instructions: denies Aspirin Instructions: continue  ERAS Protcol - NPO  COVID TEST- n/a  Anesthesia review: no  Patient verbally denies any shortness of breath, fever, cough and chest pain during phone call   -------------  SDW INSTRUCTIONS given:  Your procedure is scheduled on March 29, 2024.  Report to [facility] Main Entrance A at 6:45 A.M., and check in at the Admitting office.  Call this number if you have problems the morning of surgery:  [phone]   Remember:  Do not eat or drink after midnight the night before your surgery     Take these medicines the morning of surgery with A SIP OF WATER   amLODipine (NORVASC)  atorvastatin (LIPITOR)  ezetimibe (ZETIA)  omeprazole  (PRILOSEC)    As of today, STOP taking any Aspirin (unless otherwise instructed by your surgeon) Aleve, Naproxen, Ibuprofen, Motrin, Advil, Goody's, BC's, all herbal medications, fish oil, and all vitamins.                      Do not wear jewelry, make up, or nail polish            Do not wear lotions, powders, perfumes/colognes, or deodorant.            Do not shave 48 hours prior to surgery.  Men may shave face and neck.            Do not bring valuables to the hospital.            [facility] is not responsible for any belongings or valuables.  Do NOT Smoke (Tobacco/Vaping) 24 hours prior to your procedure If you use a CPAP at night, you may bring all equipment for your overnight stay.   Contacts, glasses, dentures or bridgework may not be worn into surgery.      For patients admitted to the hospital, discharge time will be determined by your treatment team.    Patients discharged the day of surgery will not be allowed to drive home, and someone needs to stay with them for 24 hours.    Special instructions:   [place]- Preparing For Surgery  Before surgery, you can play an important role. Because skin is not sterile, your skin needs to be as free of germs as possible. You can reduce the number of germs on your skin by washing with CHG (chlorahexidine gluconate) Soap before surgery.  CHG is an antiseptic cleaner which kills germs and bonds with the skin to continue killing germs even after washing.    Oral Hygiene is also important to reduce your risk of infection.  Remember - BRUSH YOUR TEETH THE MORNING OF SURGERY WITH YOUR REGULAR TOOTHPASTE  Please do not use if you have an allergy to CHG or antibacterial soaps. If your skin becomes reddened/irritated stop using the CHG.  Do not shave (including legs and underarms) for at least 48 hours prior to first CHG shower. It is OK to shave your face.  Please follow these instructions carefully.   Shower the NIGHT BEFORE SURGERY and the MORNING OF SURGERY with DIAL Soap.  Pat yourself dry with a CLEAN TOWEL.  Wear CLEAN PAJAMAS to bed the night before surgery  Place CLEAN SHEETS on your bed the night of your first shower and DO NOT SLEEP WITH PETS.   Day of Surgery: Please shower morning of surgery  Wear Clean/Comfortable clothing the morning of surgery Do not apply any deodorants/lotions.   Remember to brush your teeth WITH YOUR REGULAR TOOTHPASTE.   Questions were answered. Patient verbalized understanding of instructions.

## 2024-03-28 NOTE — Telephone Encounter (Signed)
 For the codes 68372, [id number], [phone] Prior Auth Not Required Refer # [id number]

## 2024-03-28 NOTE — Telephone Encounter (Signed)
 Noted. Nothing further needed.

## 2024-03-28 NOTE — H&P (View-Only) (Signed)
 Assessment & Plan:   1. Lung mass (Primary)  Nodule Location: RUL Nodule Size: 5 cm Nodule Spiculation: Yes Associated Lymphadenopathy: Yes Smoking Status (current) and pack years: 100 Extrathoracic cancer > 5 years prior (no) SPN malignancy risk score [facility]): 95+ % risk of malignancy ECOG: 0  The patient is here to discuss their imaging abnormalities which include a spiculated large RUL mass with associated hilar and mediastinal lymphadenopathy that is highly concerning for primary lung malignancy. I have discussed these findings with Eric Ochoa and his daughter, and we reviewed imaging. Explained high suspicion for malignancy. Based on imaging, this is at least T3 (at least IIB), but we will need PET/CT and EBUS for proper staging.  We discussed the importance of diagnosis and staging in lung malignancies, and the approach to obtaining a tissue diagnosis which would include robotic assisted navigational bronchoscopy with endobronchial ultrasound guided sampling.  We also discussed the risks associated with the procedure which include a < 2% risk of pneumothorax, infection, bleeding, and nondiagnostic procedure in detail.  I explained that patients typically are able to return home the same day of the procedure, but in rare cases admission to the hospital for observation and treatment is required.  After our discussion, the patient elected to proceed with the procedure  Recommendations: Robotic Assisted Navigational Bronchoscopy EBUS with TBNA   I spent 60 minutes caring for this patient today, including preparing to see the patient, obtaining a medical history , reviewing a separately obtained history, performing a medically appropriate examination and/or evaluation, counseling and educating the patient/family/caregiver, ordering medications, tests, or procedures, documenting clinical information in the electronic health record, independently interpreting results (not  separately reported/billed) and communicating results to the patient/family/caregiver, and care coordination (not separately reported/billed)  Eric November, MD [facility]   End of visit medications:  No orders of the defined types were placed in this encounter.    Current Outpatient Medications:    amLODipine (NORVASC) 10 MG tablet, Take 10 mg by mouth daily. , Disp: , Rfl:    amoxicillin  (AMOXIL ) 500 MG tablet, 2 PO BID FOR 10 DAYS, Disp: 40 tablet, Rfl: 0   aspirin EC 81 MG tablet, Take 81 mg by mouth daily., Disp: , Rfl:    atorvastatin (LIPITOR) 40 MG tablet, Take 40 mg by mouth daily., Disp: , Rfl:    cephALEXin  (KEFLEX ) 500 MG capsule, Take 1 capsule (500 mg total) by mouth 2 (two) times daily., Disp: 14 capsule, Rfl: 0   chlorhexidine  (HIBICLENS ) 4 % external liquid, Apply topically daily as needed., Disp: 236 mL, Rfl: 0   Choline Fenofibrate (FENOFIBRIC ACID) 135 MG CPDR, Take 135 mg by mouth daily. , Disp: , Rfl:    clarithromycin  (BIAXIN ) 500 MG tablet, 1 PO BID FOR 10 DAYS., Disp: 20 tablet, Rfl: 0   ezetimibe (ZETIA) 10 MG tablet, Take 1 tablet by mouth daily., Disp: , Rfl:    FARXIGA 10 MG TABS tablet, TAKE 1 TABLET BY MOUTH DAILY FOR PROTEIN IN URINE, Disp: , Rfl:    ferrous sulfate 325 (65 FE) MG tablet, Take 325 mg by mouth daily with breakfast., Disp: , Rfl:    gabapentin (NEURONTIN) 800 MG tablet, Take 800 mg by mouth at bedtime. , Disp: , Rfl:    Iron-FA-B Cmp-C-Biot-Probiotic (FUSION PLUS) CAPS, Take 1 capsule by mouth daily., Disp: , Rfl:    losartan (COZAAR) 100 MG tablet, Take 100 mg by mouth daily. , Disp: , Rfl:  metFORMIN (GLUCOPHAGE) 1000 MG tablet, Take 1,000 mg by mouth daily with breakfast. , Disp: , Rfl:    mupirocin  ointment (BACTROBAN ) 2 %, Apply 1 Application topically daily., Disp: 22 g, Rfl: 0   omeprazole  (PRILOSEC) 20 MG capsule, TAKE ONE CAPSULE BY MOUTH 30 MINUTES PRIOR TO BREAKFAST AND SUPPER, Disp: 180 capsule, Rfl: 0    simvastatin (ZOCOR) 40 MG tablet, Take 40 mg by mouth daily. , Disp: , Rfl:    Subjective:   [name] GENDER: male DOB: 1941-04-26, MRN: [id number]  Chief Complaint  Patient presents with   New Patient (Initial Visit)    Lung Mass, CT: 03/15/24, PET: 03/28/24  No cough but does have DOE but no noticeable SOB while sitting. No wheezing. Still a current smoker. Wants to understand upcoming bronchoscopy further.    HPI  Eric Ochoa is a pleasant 83 year old male with a history of DM and HTN who presents to clinic for the evaluation of a lung mass.  The patient had routine imaging which showed a RUL mass concerning for malignancy. He was referred to oncology and pulmonary for evaluation. He reports symptoms of exertional dyspnea for one year. He also reports an occasional dry cough for 6 months. He also reports a 10 lbs weight loss over the past 6 months. No fevers, chills, or night sweats reported.  CT chest obtained 03/13/2024 showed a 5 cm spiculated RUL mass, with prominent hilar and mediastinal lymph nodes. He has a PET/CT scheduled for today.  He has a history of smoking, starting at age 83. At his max, he smoked two packs a day. He currently smokes a pack a day. He previously worked for a [employer] (medical), [employer].  Ancillary information including prior medications, full medical/surgical/family/social histories, and PFTs (when available) are listed below and have been reviewed.   Review of Systems  Constitutional:  Positive for malaise/fatigue and weight loss. Negative for chills and fever.  Respiratory:  Positive for cough and shortness of breath. Negative for hemoptysis, sputum production and wheezing.   Cardiovascular:  Negative for chest pain.     Objective:   Vitals:   03/28/24 0857  BP: 110/60  Pulse: 63  Temp: 98.1 F (36.7 C)  TempSrc: Oral  SpO2: 97%  Weight: 138 lb 3.2 oz (62.7 kg)  Height: 6' (1.829 m)   97% on  RA BMI Readings from Last 3 Encounters:  03/28/24 18.74 kg/m  03/21/24 20.28 kg/m  12/08/23 21.24 kg/m   Wt Readings from Last 3 Encounters:  03/28/24 138 lb 3.2 oz (62.7 kg)  03/21/24 140 lb 14 oz (63.9 kg)  12/08/23 156 lb 9.6 oz (71 kg)    Physical Exam Constitutional:      General: He is not in acute distress.    Appearance: He is not ill-appearing.  Cardiovascular:     Rate and Rhythm: Normal rate and regular rhythm.     Pulses: Normal pulses.     Heart sounds: Normal heart sounds.  Pulmonary:     Effort: Pulmonary effort is normal.     Breath sounds: Rales (faint bibasilar rales) present. No wheezing or rhonchi.  Abdominal:     Palpations: Abdomen is soft.  Neurological:     General: No focal deficit present.     Mental Status: He is alert and oriented to person, place, and time. Mental status is at baseline.       Ancillary Information  Past Medical History:  Diagnosis Date   Abnormal results of kidney function studies    Abnormal weight loss    Disorder of kidney and ureter    Essential hypertension    Hereditary and idiopathic neuropathy    Hypercalcemia    Iron deficiency anemia    Mixed hyperlipidemia    Mononeuritis multiplex    Tobacco use    Type 2 diabetes mellitus (HCC)      Family History  Problem Relation Age of Onset   Colon cancer Neg Hx      Past Surgical History:  Procedure Laterality Date   COLONOSCOPY N/A 11/01/2016   Procedure: COLONOSCOPY;  Surgeon: Margo LITTIE Haddock, MD;  Location: [facility];  Service: Endoscopy;  Laterality: N/A;  1:45pm   ESOPHAGOGASTRODUODENOSCOPY N/A 11/01/2016   Procedure: ESOPHAGOGASTRODUODENOSCOPY (EGD);  Surgeon: Margo LITTIE Haddock, MD;  Location: [facility];  Service: Endoscopy;  Laterality: N/A;   LAPAROSCOPIC APPENDECTOMY N/A 1975    Social History   Socioeconomic History   Marital status: Widowed    Spouse name: Not on file   Number of children: Not on file   Years of education: Not on file    Highest education level: Not on file  Occupational History   Not on file  Tobacco Use   Smoking status: Every Day    Current packs/day: 0.50    Average packs/day: 0.7 packs/day for 32.6 years (23.8 ttl pk-yrs)    Types: Cigarettes    Start date: 2023   Smokeless tobacco: Never   Tobacco comments:    Smoked 2 ppd at his heaviest -- 03/28/24      Substance and Sexual Activity   Alcohol use: No   Drug use: No   Sexual activity: Not on file  Other Topics Concern   Not on file  Social History Narrative   Not on file   Social Drivers of Health   Financial Resource Strain: Not on file  Food Insecurity: No Food Insecurity (03/21/2024)   Hunger Vital Sign    Worried About Running Out of Food in the Last Year: Never true    Ran Out of Food in the Last Year: Never true  Transportation Needs: No Transportation Needs (03/21/2024)   PRAPARE - Administrator, Civil Service (Medical): No    Lack of Transportation (Non-Medical): No  Physical Activity: Not on file  Stress: Not on file  Social Connections: Not on file  Intimate Partner Violence: Not At Risk (03/21/2024)   Humiliation, Afraid, Rape, and Kick questionnaire    Fear of Current or Ex-Partner: No    Emotionally Abused: No    Physically Abused: No    Sexually Abused: No     No Known Allergies   CBC    Component Value Date/Time   WBC 6.9 02/19/2009 0026   RBC 4.76 02/19/2009 0026   HGB 13.2 02/19/2009 0026   HCT 38.8 (L) 02/19/2009 0026   PLT 179 02/19/2009 0026   MCV 81.5 02/19/2009 0026   MCHC 34.0 02/19/2009 0026   RDW 14.7 02/19/2009 0026   LYMPHSABS 2.4 02/19/2009 0026   MONOABS 0.5 02/19/2009 0026   EOSABS 0.3 02/19/2009 0026   BASOSABS 0.1 02/19/2009 0026    Pulmonary Functions Testing Results:     No data to display          Outpatient Medications Prior to Visit  Medication Sig Dispense Refill   amLODipine (NORVASC) 10 MG tablet Take 10 mg by mouth  daily.      amoxicillin  (AMOXIL ) 500  MG tablet 2 PO BID FOR 10 DAYS 40 tablet 0   aspirin EC 81 MG tablet Take 81 mg by mouth daily.     atorvastatin (LIPITOR) 40 MG tablet Take 40 mg by mouth daily.     cephALEXin  (KEFLEX ) 500 MG capsule Take 1 capsule (500 mg total) by mouth 2 (two) times daily. 14 capsule 0   chlorhexidine  (HIBICLENS ) 4 % external liquid Apply topically daily as needed. 236 mL 0   Choline Fenofibrate (FENOFIBRIC ACID) 135 MG CPDR Take 135 mg by mouth daily.      clarithromycin  (BIAXIN ) 500 MG tablet 1 PO BID FOR 10 DAYS. 20 tablet 0   ezetimibe (ZETIA) 10 MG tablet Take 1 tablet by mouth daily.     FARXIGA 10 MG TABS tablet TAKE 1 TABLET BY MOUTH DAILY FOR PROTEIN IN URINE     ferrous sulfate 325 (65 FE) MG tablet Take 325 mg by mouth daily with breakfast.     gabapentin (NEURONTIN) 800 MG tablet Take 800 mg by mouth at bedtime.      Iron-FA-B Cmp-C-Biot-Probiotic (FUSION PLUS) CAPS Take 1 capsule by mouth daily.     losartan (COZAAR) 100 MG tablet Take 100 mg by mouth daily.      metFORMIN (GLUCOPHAGE) 1000 MG tablet Take 1,000 mg by mouth daily with breakfast.      mupirocin  ointment (BACTROBAN ) 2 % Apply 1 Application topically daily. 22 g 0   omeprazole  (PRILOSEC) 20 MG capsule TAKE ONE CAPSULE BY MOUTH 30 MINUTES PRIOR TO BREAKFAST AND SUPPER 180 capsule 0   simvastatin (ZOCOR) 40 MG tablet Take 40 mg by mouth daily.      No facility-administered medications prior to visit.

## 2024-03-28 NOTE — Progress Notes (Signed)
 Assessment & Plan:   1. Lung mass (Primary)  Nodule Location: RUL Nodule Size: 5 cm Nodule Spiculation: Yes Associated Lymphadenopathy: Yes Smoking Status (current) and pack years: 100 Extrathoracic cancer > 5 years prior (no) SPN malignancy risk score [facility]): 95+ % risk of malignancy ECOG: 0  The patient is here to discuss their imaging abnormalities which include a spiculated large RUL mass with associated hilar and mediastinal lymphadenopathy that is highly concerning for primary lung malignancy. I have discussed these findings with Eric Ochoa and his daughter, and we reviewed imaging. Explained high suspicion for malignancy. Based on imaging, this is at least T3 (at least IIB), but we will need PET/CT and EBUS for proper staging.  We discussed the importance of diagnosis and staging in lung malignancies, and the approach to obtaining a tissue diagnosis which would include robotic assisted navigational bronchoscopy with endobronchial ultrasound guided sampling.  We also discussed the risks associated with the procedure which include a < 2% risk of pneumothorax, infection, bleeding, and nondiagnostic procedure in detail.  I explained that patients typically are able to return home the same day of the procedure, but in rare cases admission to the hospital for observation and treatment is required.  After our discussion, the patient elected to proceed with the procedure  Recommendations: Robotic Assisted Navigational Bronchoscopy EBUS with TBNA   I spent 60 minutes caring for this patient today, including preparing to see the patient, obtaining a medical history , reviewing a separately obtained history, performing a medically appropriate examination and/or evaluation, counseling and educating the patient/family/caregiver, ordering medications, tests, or procedures, documenting clinical information in the electronic health record, independently interpreting results (not  separately reported/billed) and communicating results to the patient/family/caregiver, and care coordination (not separately reported/billed)  Belva November, MD [facility]   End of visit medications:  No orders of the defined types were placed in this encounter.    Current Outpatient Medications:    amLODipine (NORVASC) 10 MG tablet, Take 10 mg by mouth daily. , Disp: , Rfl:    amoxicillin  (AMOXIL ) 500 MG tablet, 2 PO BID FOR 10 DAYS, Disp: 40 tablet, Rfl: 0   aspirin EC 81 MG tablet, Take 81 mg by mouth daily., Disp: , Rfl:    atorvastatin (LIPITOR) 40 MG tablet, Take 40 mg by mouth daily., Disp: , Rfl:    cephALEXin  (KEFLEX ) 500 MG capsule, Take 1 capsule (500 mg total) by mouth 2 (two) times daily., Disp: 14 capsule, Rfl: 0   chlorhexidine  (HIBICLENS ) 4 % external liquid, Apply topically daily as needed., Disp: 236 mL, Rfl: 0   Choline Fenofibrate (FENOFIBRIC ACID) 135 MG CPDR, Take 135 mg by mouth daily. , Disp: , Rfl:    clarithromycin  (BIAXIN ) 500 MG tablet, 1 PO BID FOR 10 DAYS., Disp: 20 tablet, Rfl: 0   ezetimibe (ZETIA) 10 MG tablet, Take 1 tablet by mouth daily., Disp: , Rfl:    FARXIGA 10 MG TABS tablet, TAKE 1 TABLET BY MOUTH DAILY FOR PROTEIN IN URINE, Disp: , Rfl:    ferrous sulfate 325 (65 FE) MG tablet, Take 325 mg by mouth daily with breakfast., Disp: , Rfl:    gabapentin (NEURONTIN) 800 MG tablet, Take 800 mg by mouth at bedtime. , Disp: , Rfl:    Iron-FA-B Cmp-C-Biot-Probiotic (FUSION PLUS) CAPS, Take 1 capsule by mouth daily., Disp: , Rfl:    losartan (COZAAR) 100 MG tablet, Take 100 mg by mouth daily. , Disp: , Rfl:  metFORMIN (GLUCOPHAGE) 1000 MG tablet, Take 1,000 mg by mouth daily with breakfast. , Disp: , Rfl:    mupirocin  ointment (BACTROBAN ) 2 %, Apply 1 Application topically daily., Disp: 22 g, Rfl: 0   omeprazole  (PRILOSEC) 20 MG capsule, TAKE ONE CAPSULE BY MOUTH 30 MINUTES PRIOR TO BREAKFAST AND SUPPER, Disp: 180 capsule, Rfl: 0    simvastatin (ZOCOR) 40 MG tablet, Take 40 mg by mouth daily. , Disp: , Rfl:    Subjective:   [name] GENDER: male DOB: 1941-04-26, MRN: [id number]  Chief Complaint  Patient presents with   New Patient (Initial Visit)    Lung Mass, CT: 03/15/24, PET: 03/28/24  No cough but does have DOE but no noticeable SOB while sitting. No wheezing. Still a current smoker. Wants to understand upcoming bronchoscopy further.    HPI  Eric Ochoa is a pleasant 83 year old male with a history of DM and HTN who presents to clinic for the evaluation of a lung mass.  The patient had routine imaging which showed a RUL mass concerning for malignancy. He was referred to oncology and pulmonary for evaluation. He reports symptoms of exertional dyspnea for one year. He also reports an occasional dry cough for 6 months. He also reports a 10 lbs weight loss over the past 6 months. No fevers, chills, or night sweats reported.  CT chest obtained 03/13/2024 showed a 5 cm spiculated RUL mass, with prominent hilar and mediastinal lymph nodes. He has a PET/CT scheduled for today.  He has a history of smoking, starting at age 83. At his max, he smoked two packs a day. He currently smokes a pack a day. He previously worked for a [employer] (medical), [employer].  Ancillary information including prior medications, full medical/surgical/family/social histories, and PFTs (when available) are listed below and have been reviewed.   Review of Systems  Constitutional:  Positive for malaise/fatigue and weight loss. Negative for chills and fever.  Respiratory:  Positive for cough and shortness of breath. Negative for hemoptysis, sputum production and wheezing.   Cardiovascular:  Negative for chest pain.     Objective:   Vitals:   03/28/24 0857  BP: 110/60  Pulse: 63  Temp: 98.1 F (36.7 C)  TempSrc: Oral  SpO2: 97%  Weight: 138 lb 3.2 oz (62.7 kg)  Height: 6' (1.829 m)   97% on  RA BMI Readings from Last 3 Encounters:  03/28/24 18.74 kg/m  03/21/24 20.28 kg/m  12/08/23 21.24 kg/m   Wt Readings from Last 3 Encounters:  03/28/24 138 lb 3.2 oz (62.7 kg)  03/21/24 140 lb 14 oz (63.9 kg)  12/08/23 156 lb 9.6 oz (71 kg)    Physical Exam Constitutional:      General: He is not in acute distress.    Appearance: He is not ill-appearing.  Cardiovascular:     Rate and Rhythm: Normal rate and regular rhythm.     Pulses: Normal pulses.     Heart sounds: Normal heart sounds.  Pulmonary:     Effort: Pulmonary effort is normal.     Breath sounds: Rales (faint bibasilar rales) present. No wheezing or rhonchi.  Abdominal:     Palpations: Abdomen is soft.  Neurological:     General: No focal deficit present.     Mental Status: He is alert and oriented to person, place, and time. Mental status is at baseline.       Ancillary Information  Past Medical History:  Diagnosis Date   Abnormal results of kidney function studies    Abnormal weight loss    Disorder of kidney and ureter    Essential hypertension    Hereditary and idiopathic neuropathy    Hypercalcemia    Iron deficiency anemia    Mixed hyperlipidemia    Mononeuritis multiplex    Tobacco use    Type 2 diabetes mellitus (HCC)      Family History  Problem Relation Age of Onset   Colon cancer Neg Hx      Past Surgical History:  Procedure Laterality Date   COLONOSCOPY N/A 11/01/2016   Procedure: COLONOSCOPY;  Surgeon: Margo LITTIE Haddock, MD;  Location: [facility];  Service: Endoscopy;  Laterality: N/A;  1:45pm   ESOPHAGOGASTRODUODENOSCOPY N/A 11/01/2016   Procedure: ESOPHAGOGASTRODUODENOSCOPY (EGD);  Surgeon: Margo LITTIE Haddock, MD;  Location: [facility];  Service: Endoscopy;  Laterality: N/A;   LAPAROSCOPIC APPENDECTOMY N/A 1975    Social History   Socioeconomic History   Marital status: Widowed    Spouse name: Not on file   Number of children: Not on file   Years of education: Not on file    Highest education level: Not on file  Occupational History   Not on file  Tobacco Use   Smoking status: Every Day    Current packs/day: 0.50    Average packs/day: 0.7 packs/day for 32.6 years (23.8 ttl pk-yrs)    Types: Cigarettes    Start date: 2023   Smokeless tobacco: Never   Tobacco comments:    Smoked 2 ppd at his heaviest -- 03/28/24      Substance and Sexual Activity   Alcohol use: No   Drug use: No   Sexual activity: Not on file  Other Topics Concern   Not on file  Social History Narrative   Not on file   Social Drivers of Health   Financial Resource Strain: Not on file  Food Insecurity: No Food Insecurity (03/21/2024)   Hunger Vital Sign    Worried About Running Out of Food in the Last Year: Never true    Ran Out of Food in the Last Year: Never true  Transportation Needs: No Transportation Needs (03/21/2024)   PRAPARE - Administrator, Civil Service (Medical): No    Lack of Transportation (Non-Medical): No  Physical Activity: Not on file  Stress: Not on file  Social Connections: Not on file  Intimate Partner Violence: Not At Risk (03/21/2024)   Humiliation, Afraid, Rape, and Kick questionnaire    Fear of Current or Ex-Partner: No    Emotionally Abused: No    Physically Abused: No    Sexually Abused: No     No Known Allergies   CBC    Component Value Date/Time   WBC 6.9 02/19/2009 0026   RBC 4.76 02/19/2009 0026   HGB 13.2 02/19/2009 0026   HCT 38.8 (L) 02/19/2009 0026   PLT 179 02/19/2009 0026   MCV 81.5 02/19/2009 0026   MCHC 34.0 02/19/2009 0026   RDW 14.7 02/19/2009 0026   LYMPHSABS 2.4 02/19/2009 0026   MONOABS 0.5 02/19/2009 0026   EOSABS 0.3 02/19/2009 0026   BASOSABS 0.1 02/19/2009 0026    Pulmonary Functions Testing Results:     No data to display          Outpatient Medications Prior to Visit  Medication Sig Dispense Refill   amLODipine (NORVASC) 10 MG tablet Take 10 mg by mouth  daily.      amoxicillin  (AMOXIL ) 500  MG tablet 2 PO BID FOR 10 DAYS 40 tablet 0   aspirin EC 81 MG tablet Take 81 mg by mouth daily.     atorvastatin (LIPITOR) 40 MG tablet Take 40 mg by mouth daily.     cephALEXin  (KEFLEX ) 500 MG capsule Take 1 capsule (500 mg total) by mouth 2 (two) times daily. 14 capsule 0   chlorhexidine  (HIBICLENS ) 4 % external liquid Apply topically daily as needed. 236 mL 0   Choline Fenofibrate (FENOFIBRIC ACID) 135 MG CPDR Take 135 mg by mouth daily.      clarithromycin  (BIAXIN ) 500 MG tablet 1 PO BID FOR 10 DAYS. 20 tablet 0   ezetimibe (ZETIA) 10 MG tablet Take 1 tablet by mouth daily.     FARXIGA 10 MG TABS tablet TAKE 1 TABLET BY MOUTH DAILY FOR PROTEIN IN URINE     ferrous sulfate 325 (65 FE) MG tablet Take 325 mg by mouth daily with breakfast.     gabapentin (NEURONTIN) 800 MG tablet Take 800 mg by mouth at bedtime.      Iron-FA-B Cmp-C-Biot-Probiotic (FUSION PLUS) CAPS Take 1 capsule by mouth daily.     losartan (COZAAR) 100 MG tablet Take 100 mg by mouth daily.      metFORMIN (GLUCOPHAGE) 1000 MG tablet Take 1,000 mg by mouth daily with breakfast.      mupirocin  ointment (BACTROBAN ) 2 % Apply 1 Application topically daily. 22 g 0   omeprazole  (PRILOSEC) 20 MG capsule TAKE ONE CAPSULE BY MOUTH 30 MINUTES PRIOR TO BREAKFAST AND SUPPER 180 capsule 0   simvastatin (ZOCOR) 40 MG tablet Take 40 mg by mouth daily.      No facility-administered medications prior to visit.

## 2024-03-29 ENCOUNTER — Ambulatory Visit (HOSPITAL_COMMUNITY)

## 2024-03-29 ENCOUNTER — Ambulatory Visit (HOSPITAL_COMMUNITY)
Admission: RE | Admit: 2024-03-29 | Discharge: 2024-03-29 | Disposition: A | Discharge status: Home / Self Care | Attending: Student in an Organized Health Care Education/Training Program | Admitting: Student in an Organized Health Care Education/Training Program

## 2024-03-29 ENCOUNTER — Telehealth: Payer: Self-pay | Admitting: Medical Oncology

## 2024-03-29 ENCOUNTER — Ambulatory Visit (HOSPITAL_COMMUNITY): Admitting: Certified Registered Nurse Anesthetist

## 2024-03-29 ENCOUNTER — Other Ambulatory Visit: Payer: Self-pay

## 2024-03-29 ENCOUNTER — Other Ambulatory Visit (HOSPITAL_COMMUNITY)

## 2024-03-29 ENCOUNTER — Encounter (HOSPITAL_COMMUNITY)
Admission: RE | Disposition: A | Discharge status: Home / Self Care | Payer: Self-pay | Source: Home / Self Care | Attending: Student in an Organized Health Care Education/Training Program

## 2024-03-29 ENCOUNTER — Encounter (HOSPITAL_COMMUNITY): Payer: Self-pay | Admitting: Student in an Organized Health Care Education/Training Program

## 2024-03-29 DIAGNOSIS — F17210 Nicotine dependence, cigarettes, uncomplicated: Secondary | ICD-10-CM | POA: Insufficient documentation

## 2024-03-29 DIAGNOSIS — I700 Atherosclerosis of aorta: Secondary | ICD-10-CM | POA: Diagnosis not present

## 2024-03-29 DIAGNOSIS — Z79899 Other long term (current) drug therapy: Secondary | ICD-10-CM | POA: Insufficient documentation

## 2024-03-29 DIAGNOSIS — I898 Other specified noninfective disorders of lymphatic vessels and lymph nodes: Secondary | ICD-10-CM | POA: Diagnosis not present

## 2024-03-29 DIAGNOSIS — I10 Essential (primary) hypertension: Secondary | ICD-10-CM | POA: Insufficient documentation

## 2024-03-29 DIAGNOSIS — R918 Other nonspecific abnormal finding of lung field: Secondary | ICD-10-CM | POA: Insufficient documentation

## 2024-03-29 DIAGNOSIS — Z7984 Long term (current) use of oral hypoglycemic drugs: Secondary | ICD-10-CM | POA: Insufficient documentation

## 2024-03-29 DIAGNOSIS — Z48813 Encounter for surgical aftercare following surgery on the respiratory system: Secondary | ICD-10-CM | POA: Diagnosis not present

## 2024-03-29 DIAGNOSIS — C3411 Malignant neoplasm of upper lobe, right bronchus or lung: Secondary | ICD-10-CM | POA: Diagnosis not present

## 2024-03-29 DIAGNOSIS — E119 Type 2 diabetes mellitus without complications: Secondary | ICD-10-CM

## 2024-03-29 DIAGNOSIS — J439 Emphysema, unspecified: Secondary | ICD-10-CM | POA: Diagnosis not present

## 2024-03-29 HISTORY — PX: ENDOBRONCHIAL ULTRASOUND: SHX5096

## 2024-03-29 HISTORY — PX: VIDEO BRONCHOSCOPY WITH ENDOBRONCHIAL NAVIGATION: SHX6175

## 2024-03-29 LAB — BASIC METABOLIC PANEL WITH GFR
Anion gap: 11 (ref 5–15)
BUN: 19 mg/dL (ref 8–23)
CO2: 18 mmol/L — ABNORMAL LOW (ref 22–32)
Calcium: 9.3 mg/dL (ref 8.9–10.3)
Chloride: 109 mmol/L (ref 98–111)
Creatinine, Ser: 1.51 mg/dL — ABNORMAL HIGH (ref 0.61–1.24)
GFR, Estimated: 46 mL/min — ABNORMAL LOW (ref >60)
Glucose, Bld: 122 mg/dL — ABNORMAL HIGH (ref 70–99)
Potassium: 4.2 mmol/L (ref 3.5–5.1)
Sodium: 138 mmol/L (ref 135–145)

## 2024-03-29 LAB — CBC
HCT: 35.8 % — ABNORMAL LOW (ref 39.0–52.0)
Hemoglobin: 11.5 g/dL — ABNORMAL LOW (ref 13.0–17.0)
MCH: 27.3 pg (ref 26.0–34.0)
MCHC: 32.1 g/dL (ref 30.0–36.0)
MCV: 84.8 fL (ref 80.0–100.0)
Platelets: 266 K/uL (ref 150–400)
RBC: 4.22 MIL/uL (ref 4.22–5.81)
RDW: 15.4 % (ref 11.5–15.5)
WBC: 8.4 K/uL (ref 4.0–10.5)
nRBC: 0 % (ref 0.0–0.2)

## 2024-03-29 LAB — GLUCOSE, CAPILLARY
Glucose-Capillary: 137 mg/dL — ABNORMAL HIGH (ref 70–99)
Glucose-Capillary: 116 mg/dL — ABNORMAL HIGH (ref 70–99)
Glucose-Capillary: 127 mg/dL — ABNORMAL HIGH (ref 70–99)

## 2024-03-29 SURGERY — VIDEO BRONCHOSCOPY WITH ENDOBRONCHIAL NAVIGATION
Anesthesia: General | Laterality: Bilateral

## 2024-03-29 MED ORDER — DEXAMETHASONE SODIUM PHOSPHATE 10 MG/ML IJ SOLN
INTRAMUSCULAR | Status: DC | PRN
  Administered 2024-03-29: 5 mg via INTRAVENOUS

## 2024-03-29 MED ORDER — ROCURONIUM BROMIDE 10 MG/ML (PF) SYRINGE
PREFILLED_SYRINGE | INTRAVENOUS | Status: DC | PRN
  Administered 2024-03-29: 50 mg via INTRAVENOUS

## 2024-03-29 MED ORDER — CHLORHEXIDINE GLUCONATE 0.12 % MT SOLN
OROMUCOSAL | Status: AC
  Administered 2024-03-29: 15 mL
  Filled 2024-03-29: qty 15

## 2024-03-29 MED ORDER — INSULIN ASPART 100 UNIT/ML IJ SOLN: 0.0000 [IU] | INTRAMUSCULAR | Status: DC | PRN

## 2024-03-29 MED ORDER — AMISULPRIDE (ANTIEMETIC) 5 MG/2ML IV SOLN: 10.0000 mg | Freq: Once | INTRAVENOUS | Status: DC | PRN

## 2024-03-29 MED ORDER — PROPOFOL 500 MG/50ML IV EMUL
INTRAVENOUS | Status: DC | PRN
  Administered 2024-03-29: 135 ug/kg/min via INTRAVENOUS

## 2024-03-29 MED ORDER — ONDANSETRON HCL 4 MG/2ML IJ SOLN
INTRAMUSCULAR | Status: DC | PRN
  Administered 2024-03-29: 4 mg via INTRAVENOUS

## 2024-03-29 MED ORDER — PROPOFOL 10 MG/ML IV BOLUS
INTRAVENOUS | Status: DC | PRN
  Administered 2024-03-29: 120 mg via INTRAVENOUS

## 2024-03-29 MED ORDER — LACTATED RINGERS IV SOLN: INTRAVENOUS | Status: DC

## 2024-03-29 MED ORDER — FENTANYL CITRATE (PF) 100 MCG/2ML IJ SOLN: 25.0000 ug | INTRAMUSCULAR | Status: DC | PRN

## 2024-03-29 MED ORDER — LIDOCAINE 2% (20 MG/ML) 5 ML SYRINGE
INTRAMUSCULAR | Status: DC | PRN
  Administered 2024-03-29: 80 mg via INTRAVENOUS

## 2024-03-29 MED ORDER — SUGAMMADEX SODIUM 200 MG/2ML IV SOLN
INTRAVENOUS | Status: DC | PRN
  Administered 2024-03-29: 200 mg via INTRAVENOUS

## 2024-03-29 MED ORDER — FENTANYL CITRATE (PF) 250 MCG/5ML IJ SOLN
INTRAMUSCULAR | Status: DC | PRN
  Administered 2024-03-29: 50 ug via INTRAVENOUS

## 2024-03-29 MED ORDER — FENTANYL CITRATE (PF) 100 MCG/2ML IJ SOLN
INTRAMUSCULAR | Status: AC
  Filled 2024-03-29: qty 2

## 2024-03-29 NOTE — Telephone Encounter (Signed)
 PET scan done yesterday.  Pt had bronchoscopy /bx today .   08/04-Dr Hendrickson-new thoracic ( Referred by his PCP)  08/11-Dr. Sherrod.

## 2024-03-29 NOTE — Anesthesia Procedure Notes (Signed)
 Procedure Name: Intubation Date/Time: 03/29/2024 9:48 AM  Performed by: Boyce Shilling, CRNAPre-anesthesia Checklist: Patient identified, Emergency Drugs available, Suction available, Timeout performed and Patient being monitored Patient Re-evaluated:Patient Re-evaluated prior to induction Oxygen Delivery Method: Circle system utilized Preoxygenation: Pre-oxygenation with 100% oxygen Induction Type: IV induction Ventilation: Mask ventilation without difficulty Laryngoscope Size: Mac and 4 Grade View: Grade I Tube type: Oral Tube size: 8.5 mm Number of attempts: 1 Airway Equipment and Method: Stylet Placement Confirmation: ETT inserted through vocal cords under direct vision, positive ETCO2, CO2 detector and breath sounds checked- equal and bilateral Secured at: 22 cm Tube secured with: Tape Dental Injury: Teeth and Oropharynx as per pre-operative assessment

## 2024-03-29 NOTE — Procedures (Signed)
 Procedure Note  Patient: Eric Ochoa  [employer] Cios mobile C-arm was utilized to identify and biopsy of RUL mass.  Needle-in-lesion was confirmed using real-time Cios imaging, and images were uploaded to PACS.      Belva November, MD [facility] 03/29/2024 10:55 AM

## 2024-03-29 NOTE — Telephone Encounter (Signed)
 Dtr notified that pt  needs to keep appts as scheduled.

## 2024-03-29 NOTE — Interval H&P Note (Signed)
 Patient presents with RUL mass for robotic assisted navigational bronchoscopy for biopsy and tissue acquisition. We also plan for EBUS with TBNA to evaluate the stage the mediastinum. He is appropriate for the procedure and all the questions were answered.  Belva November, MD [facility] 03/29/2024 9:20 AM

## 2024-03-29 NOTE — Anesthesia Preprocedure Evaluation (Signed)
 Anesthesia Evaluation  Patient identified by MRN, date of birth, ID band Patient awake    Reviewed: Allergy & Precautions, NPO status , Patient's Chart, lab work & pertinent test results  Airway Mallampati: II  TM Distance: >3 FB Neck ROM: Full    Dental  (+) Dental Advisory Given   Pulmonary Current Smoker and Patient abstained from smoking.   breath sounds clear to auscultation       Cardiovascular hypertension, Pt. on medications  Rhythm:Regular Rate:Normal     Neuro/Psych  Neuromuscular disease    GI/Hepatic negative GI ROS, Neg liver ROS,,,  Endo/Other  diabetes, Type 2    Renal/GU CRFRenal disease     Musculoskeletal   Abdominal   Peds  Hematology  (+) Blood dyscrasia, anemia   Anesthesia Other Findings   Reproductive/Obstetrics                              Anesthesia Physical Anesthesia Plan  ASA: 3  Anesthesia Plan: General   Post-op Pain Management: Tylenol PO (pre-op)*   Induction: Intravenous  PONV Risk Score and Plan: 1 and Dexamethasone , Ondansetron  and Treatment may vary due to age or medical condition  Airway Management Planned: Oral ETT  Additional Equipment:   Intra-op Plan:   Post-operative Plan: Extubation in OR  Informed Consent: I have reviewed the patients History and Physical, chart, labs and discussed the procedure including the risks, benefits and alternatives for the proposed anesthesia with the patient or authorized representative who has indicated his/her understanding and acceptance.     Dental advisory given  Plan Discussed with: CRNA  Anesthesia Plan Comments:         Anesthesia Quick Evaluation

## 2024-03-29 NOTE — Transfer of Care (Signed)
 Immediate Anesthesia Transfer of Care Note  Patient: Eric Ochoa  Procedure(s) Performed: VIDEO BRONCHOSCOPY WITH ENDOBRONCHIAL NAVIGATION (Bilateral) ENDOBRONCHIAL ULTRASOUND (EBUS) (Bilateral)  Patient Location: PACU  Anesthesia Type:General  Level of Consciousness: awake, alert , and oriented  Airway & Oxygen Therapy: Patient Spontanous Breathing  Post-op Assessment: Report given to RN and Post -op Vital signs reviewed and stable  Post vital signs: Reviewed and stable  Last Vitals:  Vitals Value Taken Time  BP 125/55 03/29/24 11:05  Temp 36.7 C 03/29/24 11:05  Pulse 64 03/29/24 11:06  Resp 23 03/29/24 11:06  SpO2 95 % 03/29/24 11:06  Vitals shown include unfiled device data.  Last Pain:  Vitals:   03/29/24 1105  TempSrc:   PainSc: 0-No pain         Complications: No notable events documented.

## 2024-03-29 NOTE — Op Note (Signed)
 Video Bronchoscopy with Robotic Assisted Bronchoscopic Navigation   Date of Operation: 03/29/2024   Pre-op Diagnosis: lung mass  Surgeon: Belva November, MD  Assistants: Dorn Chill, MD  Anesthesia: General endotracheal anesthesia  Operation: Flexible video fiberoptic bronchoscopy with robotic assistance and biopsies.  Estimated Blood Loss: Minimal  Complications: None  Indications and History: Eric Ochoa is a 83 y.o. male with history of RUL lung mass and smoking.  Recommendation made to achieve a tissue diagnosis via robotic assisted navigational bronchoscopy.  The risks, benefits, complications, treatment options and expected outcomes were discussed with the patient.  The possibilities of pneumothorax, pneumonia, reaction to medication, pulmonary aspiration, perforation of a viscus, bleeding, failure to diagnose a condition and creating a complication requiring transfusion or operation were discussed with the patient who freely signed the consent.    Description of Procedure: The patient was seen in the Preoperative Area, was examined and was deemed appropriate to proceed.  The patient was taken to [facility] room 3, identified as Eric Ochoa and the procedure verified as Flexible Video Fiberoptic Bronchoscopy.  A Time Out was held and the above information confirmed.   Prior to the date of the procedure a high-resolution CT scan of the chest was performed. Utilizing ION software program a virtual tracheobronchial tree was generated to allow the creation of distinct navigation pathways to the patient's parenchymal abnormalities. After being taken to the operating room general anesthesia was initiated and the patient  was orally intubated. The video fiberoptic bronchoscope was introduced via the endotracheal tube and a general inspection was performed which showed normal right and left lung anatomy. Aspiration of the bilateral mainstems was completed to remove any remaining  secretions. Robotic catheter inserted into patient's endotracheal tube.   Target #1 RUL mass: The distinct navigation pathways prepared prior to this procedure were then utilized to navigate to patient's lesion identified on CT scan. The robotic catheter was secured into place and the vision probe was withdrawn.  Lesion location was approximated using fluoroscopy. Lesion position was further approximated with the radial EBUS, and a concentric view was obtained. Local registration and targeting was performed using Siem[employer] Cios mobile C-arm three-dimensional imaging and tool-in-lesion confirmation was obtained. Under fluoroscopic guidance transbronchial needle brushings, transbronchial needle biopsies, and transbronchial forceps biopsies were performed to be sent for cytology and pathology.  Needle-in-lesion was confirmed using Cios mobile C-arm.  A bronchioalveolar lavage was performed in the RUL and sent for cytology.   Target #2 EBUS: The EBUS bronchoscope was then introduced and the hilar and mediastinal lymph node stations were evaluated. Stations 4L, 7, and 11R superior were evaluated and noted to be enlarged. These were biopsied with a 21G Olympus ViziShot 2 needle and samples were sent for cytology.   At the end of the procedure a general airway inspection was performed and there was no evidence of active bleeding. The bronchoscope was removed.  The patient tolerated the procedure well. There was no significant blood loss and there were no obvious complications. A post-procedural chest x-ray is pending.  Samples Target #1: RUL mass 1. Transbronchial needle brushings from RUL mass 2. Transbronchial Wang needle biopsies from RUL mass 3. Transbronchial forceps biopsies from RUL mass 4. Bronchoalveolar lavage from RUL mass  EBUS to stations 4L, 7, and 11R superior  Plans:  The patient will be discharged from the PACU to home when recovered from anesthesia and after chest x-ray is  reviewed. We will review the cytology, pathology and microbiology  results with the patient when they become available. Outpatient followup will be with myself.  Belva November, MD [facility] 03/29/2024 10:54 AM

## 2024-03-30 ENCOUNTER — Other Ambulatory Visit (HOSPITAL_COMMUNITY)

## 2024-04-01 ENCOUNTER — Encounter (HOSPITAL_COMMUNITY): Payer: Self-pay | Admitting: Student in an Organized Health Care Education/Training Program

## 2024-04-02 ENCOUNTER — Ambulatory Visit: Payer: Self-pay | Admitting: Student in an Organized Health Care Education/Training Program

## 2024-04-02 DIAGNOSIS — R918 Other nonspecific abnormal finding of lung field: Secondary | ICD-10-CM

## 2024-04-02 LAB — CYTOLOGY - NON PAP

## 2024-04-02 NOTE — Telephone Encounter (Signed)
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 Lymph node biopsy results are pending. PFT's ordered prior to visit with thoracic surgery.  Team - can we please have PFT's done before he sees Dr. Kerrin.  Thanks [employer] ----- Message ----- From: Interface, Lab In [facility] Sent: 04/02/2024   9:24 AM EDT To: Belva November, MD

## 2024-04-03 ENCOUNTER — Ambulatory Visit (INDEPENDENT_AMBULATORY_CARE_PROVIDER_SITE_OTHER): Admitting: Student in an Organized Health Care Education/Training Program

## 2024-04-03 DIAGNOSIS — R918 Other nonspecific abnormal finding of lung field: Secondary | ICD-10-CM

## 2024-04-03 LAB — PULMONARY FUNCTION TEST
DL/VA % pred: 75 %
DL/VA: 2.86 ml/min/mmHg/L
DLCO cor % pred: 46 %
DLCO cor: 11.78 ml/min/mmHg
DLCO unc % pred: 41 %
DLCO unc: 10.6 ml/min/mmHg
FEF 25-75 Post: 1.63 L/s
FEF 25-75 Pre: 1.13 L/s
FEF2575-%Change-Post: 43 %
FEF2575-%Pred-Post: 78 %
FEF2575-%Pred-Pre: 54 %
FEV1-%Change-Post: 12 %
FEV1-%Pred-Post: 68 %
FEV1-%Pred-Pre: 60 %
FEV1-Post: 2.08 L
FEV1-Pre: 1.85 L
FEV1FVC-%Change-Post: 4 %
FEV1FVC-%Pred-Pre: 92 %
FEV6-%Change-Post: 8 %
FEV6-%Pred-Post: 75 %
FEV6-%Pred-Pre: 69 %
FEV6-Post: 3 L
FEV6-Pre: 2.78 L
FEV6FVC-%Change-Post: 0 %
FEV6FVC-%Pred-Post: 106 %
FEV6FVC-%Pred-Pre: 106 %
FVC-%Change-Post: 7 %
FVC-%Pred-Post: 70 %
FVC-%Pred-Pre: 65 %
FVC-Post: 3.03 L
FVC-Pre: 2.81 L
Post FEV1/FVC ratio: 69 %
Post FEV6/FVC ratio: 99 %
Pre FEV1/FVC ratio: 66 %
Pre FEV6/FVC Ratio: 99 %
RV % pred: 81 %
RV: 2.29 L
TLC % pred: 68 %
TLC: 5.09 L

## 2024-04-03 NOTE — Anesthesia Postprocedure Evaluation (Signed)
 Anesthesia Post Note  Patient: Eric Ochoa  Procedure(s) Performed: VIDEO BRONCHOSCOPY WITH ENDOBRONCHIAL NAVIGATION (Bilateral) ENDOBRONCHIAL ULTRASOUND (EBUS) (Bilateral)     Patient location during evaluation: PACU Anesthesia Type: General Level of consciousness: awake and alert Pain management: pain level controlled Vital Signs Assessment: post-procedure vital signs reviewed and stable Respiratory status: spontaneous breathing, nonlabored ventilation, respiratory function stable and patient connected to nasal cannula oxygen Cardiovascular status: blood pressure returned to baseline and stable Postop Assessment: no apparent nausea or vomiting Anesthetic complications: no   No notable events documented.  Last Vitals:  Vitals:   03/29/24 1145 03/29/24 1149  BP: (!) 140/59   Pulse: (!) 55 (!) 53  Resp: 18 (!) 21  Temp:  36.4 C  SpO2: 94% 94%    Last Pain:  Vitals:   03/29/24 1149  TempSrc:   PainSc: 0-No pain                 Epifanio Lamar BRAVO

## 2024-04-03 NOTE — Progress Notes (Signed)
 Full PFT completed today ? ?

## 2024-04-03 NOTE — Patient Instructions (Signed)
 Full PFT completed today ? ?

## 2024-04-04 ENCOUNTER — Other Ambulatory Visit: Payer: Self-pay | Admitting: Thoracic Surgery (Cardiothoracic Vascular Surgery)

## 2024-04-04 ENCOUNTER — Ambulatory Visit
Attending: Thoracic Surgery (Cardiothoracic Vascular Surgery) | Admitting: Thoracic Surgery (Cardiothoracic Vascular Surgery)

## 2024-04-04 ENCOUNTER — Encounter: Payer: Self-pay | Admitting: Thoracic Surgery (Cardiothoracic Vascular Surgery)

## 2024-04-04 VITALS — BP 118/59 | HR 73 | Resp 20 | Ht 72.0 in | Wt 142.0 lb

## 2024-04-04 DIAGNOSIS — R918 Other nonspecific abnormal finding of lung field: Secondary | ICD-10-CM | POA: Diagnosis not present

## 2024-04-04 NOTE — Progress Notes (Signed)
 6 minute walk test  Patient ambulated 9 laps at 817ft. O2 saturations stayed at 97%. Denied SOB during test.

## 2024-04-04 NOTE — Progress Notes (Signed)
 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 HPI: 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  He recently saw Dr. Shona.  He has had significant weight loss unintentionally.  He had a CT of the chest which showed a 5 x 3.9 cm spiculated right upper lobe mass.  There was a 6 cm chest wall mass consistent with a sebaceous cyst.  There was paraseptal and centrilobular emphysema.  There was extensive aortic and coronary calcification.  That led to a PET/CT which showed the mass was hypermetabolic.  This chest wall mass was not hypermetabolic.  He was referred to Dr. Isadora.  He had bronchoscopy and endobronchial ultrasound.  The mass was a squamous cell carcinoma.  Level 7 and 4 nodes were negative.  Level 11 did not show cancer but there were no lymphoid cells either.  He previously smoked about 2 packs a day.  He has been trying to quit and is currently down to about 7 to 9 cigarettes/day.  He is retired.  Denies any chest pain, pressure, or tightness.  Says he sometimes becomes very tired with walking even with only walking about 20 feet.  Other times he can walk for quite some distance without having to stop.  Has lost 10 pounds over the past 3 months with no change in appetite.  Does complain of decreased energy.  Denies cough, hemoptysis, wheezing.  Denies headaches or visual changes.  Zubrod Score: At the time of surgery this patient's most appropriate activity status/level should be described as: []     0    Normal activity, no symptoms [x]     1    Restricted in physical strenuous activity but  ambulatory, able to do out light work []     2    Ambulatory and capable of self care, unable to do work activities, up and about >50 % of waking hours                              []     3    Only limited self care, in bed greater than 50% of waking hours []     4    Completely disabled, no self care, confined to bed or chair []     5    Moribund   Past Medical History:  Diagnosis Date   Abnormal results of kidney function studies    Abnormal weight loss    Disorder of kidney and ureter    Essential hypertension    Hereditary and idiopathic neuropathy    Hypercalcemia    Iron deficiency anemia    Mixed hyperlipidemia    Mononeuritis multiplex    Tobacco use    Type 2 diabetes mellitus (HCC)     Past Surgical History:  Procedure Laterality Date   COLONOSCOPY N/A 11/01/2016   Procedure: COLONOSCOPY;  Surgeon: Margo LITTIE Haddock, MD;  Location: [facility];  Service: Endoscopy;  Laterality: N/A;  1:45pm   ENDOBRONCHIAL ULTRASOUND Bilateral 03/29/2024   Procedure: ENDOBRONCHIAL ULTRASOUND (EBUS);  Surgeon: Isadora Hose, MD;  Location: [facility];  Service: Pulmonary;  Laterality: Bilateral;   ESOPHAGOGASTRODUODENOSCOPY N/A 11/01/2016   Procedure: ESOPHAGOGASTRODUODENOSCOPY (EGD);  Surgeon: Margo LITTIE Haddock, MD;  Location: [facility];  Service: Endoscopy;  Laterality: N/A;   LAPAROSCOPIC APPENDECTOMY N/A 1975   VIDEO BRONCHOSCOPY WITH ENDOBRONCHIAL NAVIGATION Bilateral 03/29/2024   Procedure: VIDEO BRONCHOSCOPY WITH ENDOBRONCHIAL NAVIGATION;  Surgeon: Isadora Hose, MD;  Location: [facility];  Service: Pulmonary;  Laterality: Bilateral;    Family History  Problem Relation Age of Onset   Colon cancer Neg Hx     Social History Social History   Tobacco Use   Smoking status: Every Day    Current packs/day: 0.50    Average packs/day: 0.7 packs/day for 32.6 years (23.8 ttl pk-yrs)    Types: Cigarettes    Start date: 2023   Smokeless tobacco: Never   Tobacco comments:    Smoked 2  ppd at his heaviest -- 03/28/24      Substance Use Topics   Alcohol use: No   Drug use: No    Current Outpatient Medications  Medication Sig Dispense Refill   amLODipine (NORVASC) 10 MG tablet Take 10 mg by mouth daily.      aspirin EC 81 MG tablet Take 81 mg by mouth daily.     atorvastatin (LIPITOR) 40 MG tablet Take 40 mg by mouth daily.     chlorhexidine  (HIBICLENS ) 4 % external liquid Apply topically daily as needed. 236 mL 0   Choline Fenofibrate (FENOFIBRIC ACID) 135 MG CPDR Take 135 mg by mouth daily.      ezetimibe (ZETIA) 10 MG tablet Take 1 tablet by mouth daily.     FARXIGA 10 MG TABS tablet TAKE 1 TABLET BY MOUTH DAILY FOR PROTEIN IN URINE     ferrous sulfate 325 (65 FE) MG tablet Take 325 mg by mouth daily with breakfast.     gabapentin (NEURONTIN) 800 MG tablet Take 800 mg by mouth at bedtime.      Iron-FA-B Cmp-C-Biot-Probiotic (FUSION PLUS) CAPS Take 1 capsule by mouth daily.     losartan (COZAAR) 100 MG tablet Take 100 mg by mouth daily.      metFORMIN (GLUCOPHAGE) 1000 MG tablet Take 1,000 mg by mouth daily with breakfast.      mupirocin  ointment (BACTROBAN ) 2 % Apply 1 Application topically daily. 22 g 0   omeprazole  (PRILOSEC) 20 MG capsule TAKE ONE CAPSULE BY MOUTH 30 MINUTES PRIOR TO BREAKFAST AND SUPPER 180 capsule 0   simvastatin (ZOCOR) 40 MG tablet Take 40 mg by mouth daily.      amoxicillin  (AMOXIL ) 500 MG tablet 2 PO BID FOR 10 DAYS (Patient not taking: Reported on 04/04/2024) 40 tablet 0   cephALEXin  (KEFLEX ) 500 MG capsule Take 1 capsule (500 mg total) by mouth 2 (two) times daily. (Patient not taking: Reported on 04/04/2024) 14 capsule 0   clarithromycin  (BIAXIN ) 500 MG tablet 1 PO BID FOR 10 DAYS. (Patient not taking: Reported on 04/04/2024) 20 tablet 0   No current facility-administered medications for this visit.    No Known Allergies  Review of Systems  Constitutional:  Positive for fatigue and unexpected weight change.  HENT:  Negative for dental  problem, trouble swallowing and voice change.   Respiratory:  Negative for cough, shortness of breath and wheezing.   Cardiovascular:  Negative for chest pain.  Gastrointestinal:  Positive for abdominal pain (Reflux).  Genitourinary:  Negative for difficulty urinating and dysuria.  Musculoskeletal:  Positive for gait problem.  Neurological:  Negative for seizures and weakness.  Hematological:  Negative for adenopathy. Does not bruise/bleed easily.  All other systems reviewed and are negative.   BP (!) 118/59   Pulse 73   Resp 20   Ht 6' (1.829 m)   Wt 142 lb (64.4 kg)   SpO2 94% Comment: RA  BMI 19.26 kg/m  Physical Exam Vitals reviewed.  Constitutional:      Appearance: Normal appearance.  HENT:     Head: Normocephalic and atraumatic.  Eyes:     General: No scleral icterus.    Extraocular Movements: Extraocular movements intact.  Cardiovascular:     Rate and Rhythm: Normal rate and regular rhythm.     Heart sounds: Murmur (2/6 systolic murmur) heard.  Pulmonary:     Effort: Pulmonary effort is normal. No respiratory distress.     Breath sounds: Normal breath sounds. No wheezing or rales.  Abdominal:     General: There is no distension.     Palpations: Abdomen is soft.  Musculoskeletal:     Right lower leg: No edema.     Left lower leg: No edema.  Lymphadenopathy:     Cervical: No cervical adenopathy.  Skin:    General: Skin is warm and dry.     Comments: Subcutaneous mass approximately 3 x 6 cm right posterolateral chest wall  Neurological:     General: No focal deficit present.     Mental Status: He is alert and oriented to person, place, and time.     Cranial Nerves: No cranial nerve deficit.     Motor: No weakness.    Diagnostic Tests: CT CHEST WITHOUT CONTRAST   TECHNIQUE: Multidetector CT imaging of the chest was performed following the standard protocol without IV contrast.   RADIATION DOSE REDUCTION: This exam was performed according to  the departmental dose-optimization program which includes automated exposure control, adjustment of the mA and/or kV according to patient size and/or use of iterative reconstruction technique.   COMPARISON:  None Available.   FINDINGS: Cardiovascular: Aortic atherosclerosis. Three-vessel coronary artery calcifications/stents. Normal size heart. No significant pericardial effusion/thickening.   Mediastinum/Nodes: Prominent mediastinal lymph nodes for instance a precarinal lymph node measuring 9 mm in short axis on image 66/2.   Right hilar fullness may reflect enlarged lymph nodes versus overlapping vascular structures but not definitively delineated on this noncontrast enhanced examination.   Lungs/Pleura: Spiculated right upper lobe mass with adjacent satellite nodules and architectural distortion measures 5.0 x 3.9 cm on image 55/3. There is some adjacent interlobular septal thickening and ground-glass for instance on image 63/3.   Paraseptal and centrilobular emphysema. Bibasilar subpleural reticulations with honeycombing. Mild diffuse bronchial wall thickening. Adherent debris in the trachea.   Upper Abdomen: No suspicious adrenal nodule/mass.   Musculoskeletal: Well-circumscribed 6.3 cm mass in the subcutaneous tissues along the right posterior chest wall on image 115/2 measures Hounsfield units greater than that of simple fluid.   Diffuse demineralization of bone. Multilevel degenerative change of the spine.   IMPRESSION: 1. Spiculated right upper lobe mass with adjacent satellite nodules and architectural distortion measures 5.0 x 3.9 cm, highly suspicious for primary bronchogenic neoplasm. Suggest pulmonary consultation. 2. Prominent mediastinal lymph nodes and right hilar fullness may reflect enlarged lymph nodes versus overlapping vascular structures but not definitively delineated on this noncontrast enhanced examination. 3. Well-circumscribed 6.3 cm mass in  the subcutaneous tissues along the right posterior chest wall measures Hounsfield units greater than that of simple fluid. Likely a large  sebaceous cysts consider further evaluation by dedicated ultrasound. 4. Bibasilar subpleural reticulations with honeycombing, suggestive of interstitial lung disease. 5. Aortic Atherosclerosis (ICD10-I70.0) and Emphysema (ICD10-J43.9).   These results will be called to the ordering clinician or representative by the Radiologist Assistant, and communication documented in the PACS or [employer].     Electronically Signed   By: Reyes Holder M.D.   On: 03/15/2024 16:15 NUCLEAR MEDICINE PET SKULL BASE TO THIGH   TECHNIQUE: 6.83 mCi F-18 FDG was injected intravenously. Full-ring PET imaging was performed from the skull base to thigh after the radiotracer. CT data was obtained and used for attenuation correction and anatomic localization.   Fasting blood glucose: 87 mg/dl   COMPARISON:  Noncontrast CT 03/13/2024.   FINDINGS: Mediastinal blood pool activity: SUV max 2.2   Liver activity: SUV max 3.2   NECK: There are bilateral hypermetabolic parotid lesions which could be nodes versus intrinsic parotid masses. Example on the left posteroinferior has maximum SUV of 14.3 and dimension on series 4, image 21 2.1 by 1.9 cm. There are 3 such left-sided parotid lesions. On the right side there is an intrinsic parotid lesion as well on CT image 19 measuring 12 by 10 mm and has maximum SUV of 5.2. There is also some adjacent abnormal nodes. On the right side just posteroinferior to the parotid gland are 2 such nodes present largest has maximum SUV of 10.4 and dimension on image 26 of series 4 measuring 16 by 13 mm. Left-sided focus just inferior to the parotid gland on image 29 measures 13 by 6 mm and has maximum SUV of 6.6. No other areas of abnormal uptake in the neck. Near symmetric uptake of the visualized portions of the intracranial  compartment.   Incidental CT findings: Mastoid air cells are clear. There is some mild opacity along the anterior ethmoid air cells. Prominent vascular calcifications in the neck. The submandibular glands and thyroid gland are grossly preserved.   CHEST: Heterogeneous mass identified right upper lobe as seen on the prior CT scan of the chest. On the prior CT this measured 5.0 x 3.9 cm with some satellite nodules. The main lesion today has maximum SUV 16.4 consistent with neoplasm. The small satellite areas not show clear significant abnormal uptake. No additional areas of abnormal uptake along the lung parenchyma. There is only minimal uptake along the right lung hilum with maximum SUV of 3.3. No discrete abnormal nodal uptake above blood pool otherwise along the axillary region, hilum or mediastinum.   Incidental CT findings: Heart nonenlarged. No pericardial effusion. Prominent vascular calcifications along the thoracic aorta, great vessels and coronary arteries. Normal caliber thoracic esophagus. Breathing motion. Emphysematous lung changes identified. No pneumothorax or effusion. Once again there is a chest wall lesion on the right side which is somewhat cystic and does not show abnormal uptake. Favor benign lesion.   ABDOMEN/PELVIS: No abnormal hypermetabolic activity within the liver, pancreas, adrenal glands, or spleen. No hypermetabolic lymph nodes in the abdomen or pelvis.   Incidental CT findings: Distended gallbladder with some layering stones. On this limited noncontrast attenuation correction CT, grossly the liver, spleen, adrenal glands and pancreas are preserved. Mild renal atrophy with some nonspecific perinephric stranding. No renal or ureteral stones. Underdistended urinary bladder. Extensive left-sided colonic diverticulosis. The bowel is nondilated. Air-fluid level along the stomach. Extensive vascular calcifications.   SKELETON: No focal hypermetabolic  activity to suggest skeletal metastasis.   Incidental CT findings: Scattered degenerative changes.   IMPRESSION: Hypermetabolic  right upper lobe lung mass again seen consistent with neoplasm until proven otherwise. No additional areas of abnormal uptake along the chest including along lymph nodes. Only slight uptake along the right lung hilum.   Multiple areas of abnormal uptake in the neck including involving the parotid glands and adjacent lymph nodes. The intrinsic parotid lesions at hypermetabolic could be intraparotid lymph nodes versus intrinsic mass lesions. Please correlate for any known history or dedicated further workup is recommended.   Gallstones.  Colonic diverticulosis.     Electronically Signed   By: Ranell Bring M.D.   On: 03/28/2024 14:25  I personally reviewed the CT and PET/CT images.  There is a 5 x 3.9 cm spiculated mass in the inferior right upper lobe.  Abuts and in 1 spot may cross the fissure into the superior segment.  Pleural tail extending to parietal pleura.  Borderline activity in right hilar nodes.  No evidence of distant metastasis.  Severe coronary and aortic atherosclerosis.  Multiple parotid lesions noted by radiology.  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  Squamous cell carcinoma of the lung-clinical stage IIb (T3, N0), pending MRI of the brain.  Technically resectable but he is a borderline candidate given his age, diffusion capacity, and extensive atherosclerotic cardiovascular disease.  He did a 6-minute walk test and walked 864 feet.  He did not have any shortness of breath or desaturations.  Well below ideal of 500 m  but close to borderline 300 m.  Has not ruled out for surgery but does put him in a higher risk category for perioperative complications.  I discussed the options for treatment with Mr. Vanwyk and his daughter.  They understand that surgical resection provides the best chance of a cure but there is no guarantee.  They understand chemoradiation would also be an option, and might be better in his case.  They understand surgery has significant potential for morbidity and mortality.  We did discuss the general nature of the procedure.  We briefly discussed risk such as bleeding, infection, pneumonia, MI.  Although he is not having any classical angina symptoms, he is diabetic.  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  He would need cardiology clearance before we could even consider resection.  Pulmonary function testing shows adequate flows but he does have borderline diffusion capacity is only 46% of predicted.  Again not prohibitive but another factor which increases risk for poor outcome.  He did have a heart murmur.  Will plan to check a 2D echocardiogram to further evaluate.   Plan: MR brain to complete clinical staging 2D echocardiogram Cardiology evaluation to assess for preoperative clearance. Meet with Dr. Sherrod on Monday as scheduled Return in 2 to 3 weeks to discuss results  I spent over 60 minutes in review of records, images, and in consultation with Mr. Werth today. [name], MD [facility] [phone]

## 2024-04-05 ENCOUNTER — Ambulatory Visit
Admission: RE | Admit: 2024-04-05 | Discharge: 2024-04-05 | Disposition: A | Discharge status: Home / Self Care | Source: Ambulatory Visit | Attending: Thoracic Surgery (Cardiothoracic Vascular Surgery) | Admitting: Thoracic Surgery (Cardiothoracic Vascular Surgery)

## 2024-04-05 DIAGNOSIS — G319 Degenerative disease of nervous system, unspecified: Secondary | ICD-10-CM | POA: Diagnosis not present

## 2024-04-05 DIAGNOSIS — R918 Other nonspecific abnormal finding of lung field: Secondary | ICD-10-CM

## 2024-04-05 DIAGNOSIS — C3490 Malignant neoplasm of unspecified part of unspecified bronchus or lung: Secondary | ICD-10-CM | POA: Diagnosis not present

## 2024-04-05 MED ORDER — GADOPICLENOL 0.5 MMOL/ML IV SOLN
6.4000 mL | Freq: Once | INTRAVENOUS | Status: AC | PRN
  Administered 2024-04-05: 6.4 mL via INTRAVENOUS

## 2024-04-06 ENCOUNTER — Other Ambulatory Visit: Payer: Self-pay

## 2024-04-06 DIAGNOSIS — R918 Other nonspecific abnormal finding of lung field: Secondary | ICD-10-CM

## 2024-04-09 ENCOUNTER — Inpatient Hospital Stay

## 2024-04-09 ENCOUNTER — Inpatient Hospital Stay: Attending: Oncology | Admitting: Internal Medicine

## 2024-04-09 VITALS — BP 135/57 | HR 65 | Temp 97.9°F | Resp 17 | Ht 72.0 in | Wt 138.0 lb

## 2024-04-09 DIAGNOSIS — Z79899 Other long term (current) drug therapy: Secondary | ICD-10-CM | POA: Diagnosis not present

## 2024-04-09 DIAGNOSIS — F17210 Nicotine dependence, cigarettes, uncomplicated: Secondary | ICD-10-CM | POA: Diagnosis not present

## 2024-04-09 DIAGNOSIS — R634 Abnormal weight loss: Secondary | ICD-10-CM | POA: Diagnosis not present

## 2024-04-09 DIAGNOSIS — R221 Localized swelling, mass and lump, neck: Secondary | ICD-10-CM

## 2024-04-09 DIAGNOSIS — Z7982 Long term (current) use of aspirin: Secondary | ICD-10-CM | POA: Insufficient documentation

## 2024-04-09 DIAGNOSIS — R918 Other nonspecific abnormal finding of lung field: Secondary | ICD-10-CM

## 2024-04-09 DIAGNOSIS — C3411 Malignant neoplasm of upper lobe, right bronchus or lung: Secondary | ICD-10-CM | POA: Diagnosis not present

## 2024-04-09 LAB — CBC WITH DIFFERENTIAL (CANCER CENTER ONLY)
Abs Immature Granulocytes: 0.02 K/uL (ref 0.00–0.07)
Basophils Absolute: 0.1 K/uL (ref 0.0–0.1)
Basophils Relative: 1 %
Eosinophils Absolute: 0.4 K/uL (ref 0.0–0.5)
Eosinophils Relative: 5 %
HCT: 33 % — ABNORMAL LOW (ref 39.0–52.0)
Hemoglobin: 10.8 g/dL — ABNORMAL LOW (ref 13.0–17.0)
Immature Granulocytes: 0 %
Lymphocytes Relative: 24 %
Lymphs Abs: 2.2 K/uL (ref 0.7–4.0)
MCH: 26.7 pg (ref 26.0–34.0)
MCHC: 32.7 g/dL (ref 30.0–36.0)
MCV: 81.7 fL (ref 80.0–100.0)
Monocytes Absolute: 0.6 K/uL (ref 0.1–1.0)
Monocytes Relative: 7 %
Neutro Abs: 5.7 K/uL (ref 1.7–7.7)
Neutrophils Relative %: 63 %
Platelet Count: 267 K/uL (ref 150–400)
RBC: 4.04 MIL/uL — ABNORMAL LOW (ref 4.22–5.81)
RDW: 15.5 % (ref 11.5–15.5)
WBC Count: 9.1 K/uL (ref 4.0–10.5)
nRBC: 0 % (ref 0.0–0.2)

## 2024-04-09 LAB — CMP (CANCER CENTER ONLY)
ALT: 18 U/L (ref 0–44)
AST: 16 U/L (ref 15–41)
Albumin: 3.9 g/dL (ref 3.5–5.0)
Alkaline Phosphatase: 85 U/L (ref 38–126)
Anion gap: 6 (ref 5–15)
BUN: 17 mg/dL (ref 8–23)
CO2: 25 mmol/L (ref 22–32)
Calcium: 9.2 mg/dL (ref 8.9–10.3)
Chloride: 107 mmol/L (ref 98–111)
Creatinine: 1.39 mg/dL — ABNORMAL HIGH (ref 0.61–1.24)
GFR, Estimated: 51 mL/min — ABNORMAL LOW (ref >60)
Glucose, Bld: 93 mg/dL (ref 70–99)
Potassium: 4.6 mmol/L (ref 3.5–5.1)
Sodium: 138 mmol/L (ref 135–145)
Total Bilirubin: 0.3 mg/dL (ref 0.0–1.2)
Total Protein: 7.2 g/dL (ref 6.5–8.1)

## 2024-04-09 NOTE — Progress Notes (Signed)
 [facility] [phone]   [phone]  CONSULT NOTE  REFERRING PHYSICIAN: Dr. Belva November  REASON FOR CONSULTATION:  83 years old white male recently diagnosed with lung cancer  HPI Eric Ochoa is a 83 y.o. male with past medical history significant for hypertension, hypercalcemia, iron deficiency anemia, dyslipidemia, mononeuritis multiplex, type 2 diabetes mellitus, renal insufficiency and long history of smoking.  The patient was seen by his primary care provider complaining of chest pain and shortness of breath and because of his smoking history is CT scan of the chest without contrast was performed on March 13, 2024 and it showed the spiculated right upper lobe mass with adjacent satellite nodules and architectural distortion measuring 5.0 x 3.9 cm highly suspicious for primary bronchogenic neoplasm.  There was also prominent mediastinal lymph nodes and right hilar fullness that may reflect enlarged lymph nodes versus overlapping vascular structures.  There was also will circumscribed 6.3 cm mass in the subcutaneous tissue along the right posterior chest wall likely a sebaceous cyst.  A PET scan was performed on March 28, 2024 and it showed heterogeneous mass identified in the right upper lobe that was hypermetabolic with SUV max of 16.4 consistent with neoplasm.  The small satellite areas did not show clear significant abnormal uptake and there was only minimal uptake along the right lung hilum with maximum SUV of 3.3.  There were multiple areas of abnormal uptake in the neck including the parotid glands as well as adjacent lymph nodes.  The intrinsic parotid lesion was hypermetabolic and could be intraparotid lymph nodes versus intrinsic mass lesion.  The patient was referred to Dr. 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 The tumor cells are p40 positive and TTF-1 negative, which is supportive of squamous cell differentiation.  The patient also had MRI of the brain on 04/05/2024 that showed no evidence of intracranial metastatic disease but there were multiple enhancing parotid lesions bilaterally with the largest on the left measuring 2.4 x 2.0 cm and this could reflect a primary parotid neoplasm and/or abnormal lymph nodes. The patient was referred to me today for evaluation and recommendation regarding his treatment.  He was seen by Dr. Davonna at [facility] but because of consideration of radiotherapy he will need to have medical oncologist in [place] during his treatment.  HPI  Discussed the use of AI scribe software for clinical note transcription with the patient, who gave verbal consent to proceed.  History of Present Illness Eric Ochoa is an 83 year old male with recently diagnosed non-small cell lung cancer who presents for evaluation. He is accompanied by his daughter, Eric Ochoa. He was referred by Dr. Shona after an abnormal chest examination.  He was diagnosed with non-small cell lung cancer, specifically squamous cell carcinoma, following a series of diagnostic tests. He reported that his primary care doctor heard something abnormal in his chest during a routine visit, which prompted a chest CT scan on March 13, 2024, revealing a 5.0 by 3.9 cm mass in the right upper lobe of the lung, along with satellite nodules and prominent mediastinal lymph nodes. A subsequent PET scan confirmed the mass but did not clearly show the mediastinal lymph nodes, although there was fullness in the right hilum. A bronchoscopy and  biopsy confirmed the diagnosis of squamous cell carcinoma.  An MRI of the brain was performed recently, showing no metastasis to the brain. However, it revealed nodules in the parotid  glands.  He experiences a cough without hemoptysis and has difficulty walking long distances due to weakness, particularly in his legs. He has also experienced an unintentional weight loss of 10 pounds over the past year, attributed to a lack of appetite. No chest pain, headaches, nausea, or vomiting.  His past medical history includes diabetes, hypertension, high cholesterol, anemia, and neuropathy. He has no history of heart attacks or strokes.  Socially, he lives in [place] and has a long history of smoking, having smoked for 70 years since the age of 71. He has reduced his consumption to six or seven cigarettes a day. He does not consume alcohol or use street drugs.     Past Medical History:  Diagnosis Date   Abnormal results of kidney function studies    Abnormal weight loss    Disorder of kidney and ureter    Essential hypertension    Hereditary and idiopathic neuropathy    Hypercalcemia    Iron deficiency anemia    Mixed hyperlipidemia    Mononeuritis multiplex    Tobacco use    Type 2 diabetes mellitus (HCC)       Past Surgical History:  Procedure Laterality Date   COLONOSCOPY N/A 11/01/2016   Procedure: COLONOSCOPY;  Surgeon: Margo LITTIE Haddock, MD;  Location: [facility];  Service: Endoscopy;  Laterality: N/A;  1:45pm   ENDOBRONCHIAL ULTRASOUND Bilateral 03/29/2024   Procedure: ENDOBRONCHIAL ULTRASOUND (EBUS);  Surgeon: Isadora Hose, MD;  Location: [facility] ENDOSCOPY;  Service: Pulmonary;  Laterality: Bilateral;   ESOPHAGOGASTRODUODENOSCOPY N/A 11/01/2016   Procedure: ESOPHAGOGASTRODUODENOSCOPY (EGD);  Surgeon: Margo LITTIE Haddock, MD;  Location: [facility];  Service: Endoscopy;  Laterality: N/A;   LAPAROSCOPIC APPENDECTOMY N/A 1975   VIDEO BRONCHOSCOPY WITH ENDOBRONCHIAL NAVIGATION Bilateral 03/29/2024   Procedure: VIDEO BRONCHOSCOPY WITH ENDOBRONCHIAL NAVIGATION;  Surgeon: Isadora Hose, MD;  Location: [facility] ENDOSCOPY;  Service: Pulmonary;  Laterality: Bilateral;    Family  History  Problem Relation Age of Onset   Colon cancer Neg Hx     Social History Social History   Tobacco Use   Smoking status: Every Day    Current packs/day: 0.50    Average packs/day: 0.7 packs/day for 32.6 years (23.8 ttl pk-yrs)    Types: Cigarettes    Start date: 2023   Smokeless tobacco: Never   Tobacco comments:    Smoked 2 ppd at his heaviest -- 03/28/24      Substance Use Topics   Alcohol use: No   Drug use: No    No Known Allergies  Current Outpatient Medications  Medication Sig Dispense Refill   amLODipine (NORVASC) 10 MG tablet Take 10 mg by mouth daily.      amoxicillin  (AMOXIL ) 500 MG tablet 2 PO BID FOR 10 DAYS (Patient not taking: Reported on 04/04/2024) 40 tablet 0   aspirin EC 81 MG tablet Take 81 mg by mouth daily.     atorvastatin (LIPITOR) 40 MG tablet Take 40 mg by mouth daily.     cephALEXin  (KEFLEX ) 500 MG capsule Take 1 capsule (500 mg total) by mouth 2 (two) times daily. (Patient not taking: Reported on 04/04/2024) 14 capsule 0   chlorhexidine  (HIBICLENS ) 4 % external liquid Apply topically daily as needed. 236 mL 0   Choline Fenofibrate (FENOFIBRIC ACID) 135 MG CPDR Take 135 mg by  mouth daily.      clarithromycin  (BIAXIN ) 500 MG tablet 1 PO BID FOR 10 DAYS. (Patient not taking: Reported on 04/04/2024) 20 tablet 0   ezetimibe (ZETIA) 10 MG tablet Take 1 tablet by mouth daily.     FARXIGA 10 MG TABS tablet TAKE 1 TABLET BY MOUTH DAILY FOR PROTEIN IN URINE     ferrous sulfate 325 (65 FE) MG tablet Take 325 mg by mouth daily with breakfast.     gabapentin (NEURONTIN) 800 MG tablet Take 800 mg by mouth at bedtime.      Iron-FA-B Cmp-C-Biot-Probiotic (FUSION PLUS) CAPS Take 1 capsule by mouth daily.     losartan (COZAAR) 100 MG tablet Take 100 mg by mouth daily.      metFORMIN (GLUCOPHAGE) 1000 MG tablet Take 1,000 mg by mouth daily with breakfast.      mupirocin  ointment (BACTROBAN ) 2 % Apply 1 Application topically daily. 22 g 0   omeprazole  (PRILOSEC) 20 MG  capsule TAKE ONE CAPSULE BY MOUTH 30 MINUTES PRIOR TO BREAKFAST AND SUPPER 180 capsule 0   simvastatin (ZOCOR) 40 MG tablet Take 40 mg by mouth daily.      No current facility-administered medications for this visit.    Review of Systems  Constitutional: positive for anorexia, fatigue, and weight loss Eyes: negative Ears, nose, mouth, throat, and face: negative Respiratory: positive for dyspnea on exertion Cardiovascular: negative Gastrointestinal: negative Genitourinary:negative Integument/breast: negative Hematologic/lymphatic: negative Musculoskeletal:negative Neurological: negative Behavioral/Psych: negative Endocrine: negative Allergic/Immunologic: negative  Physical Exam  MJO:jozmu, healthy, no distress, well nourished, and well developed SKIN: skin color, texture, turgor are normal, no rashes or significant lesions HEAD: Normocephalic, No masses, lesions, tenderness or abnormalities EYES: normal, PERRLA, Conjunctiva are pink and non-injected EARS: External ears normal, Canals clear OROPHARYNX:no exudate, no erythema, and lips, buccal mucosa, and tongue normal  NECK: supple, no adenopathy, no JVD LYMPH:  no palpable lymphadenopathy, no hepatosplenomegaly LUNGS: clear to auscultation , and palpation HEART: regular rate & rhythm, no murmurs, and no gallops ABDOMEN:abdomen soft, non-tender, normal bowel sounds, and no masses or organomegaly BACK: Back symmetric, no curvature., No CVA tenderness EXTREMITIES:no joint deformities, effusion, or inflammation, no edema  NEURO: alert & oriented x 3 with fluent speech, no focal motor/sensory deficits  PERFORMANCE STATUS: ECOG 1  LABORATORY DATA: Lab Results  Component Value Date   WBC 8.4 03/29/2024   HGB 11.5 (L) 03/29/2024   HCT 35.8 (L) 03/29/2024   MCV 84.8 03/29/2024   PLT 266 03/29/2024      Chemistry      Component Value Date/Time   NA 138 03/29/2024 0742   K 4.2 03/29/2024 0742   CL 109 03/29/2024 0742    CO2 18 (L) 03/29/2024 0742   BUN 19 03/29/2024 0742   CREATININE 1.51 (H) 03/29/2024 0742      Component Value Date/Time   CALCIUM 9.3 03/29/2024 0742   ALKPHOS 73 04/10/2009 2226   AST 18 04/10/2009 2226   ALT 15 04/10/2009 2226   BILITOT 0.4 04/10/2009 2226       RADIOGRAPHIC STUDIES: MR Brain W Wo Contrast Result Date: 04/05/2024 CLINICAL DATA:  Provided history: Lung mass. Metastatic disease evaluation. EXAM: MRI HEAD WITHOUT AND WITH CONTRAST TECHNIQUE: Multiplanar, multiecho pulse sequences of the brain and surrounding structures were obtained without and with intravenous contrast. CONTRAST:  6.4 mL Vueway  intravenous contrast. COMPARISON:  PET CT 03/28/2024. FINDINGS: Brain: Generalized cerebral atrophy. Chronic lacunar infarcts within the bilateral cerebral hemispheric white matter. Moderate multifocal T2  FLAIR hyperintense signal abnormality elsewhere within the cerebral white matter, nonspecific but compatible chronic small vessel ischemic disease. Mild chronic small ischemic changes also present within the pons. Tiny chronic infarcts within the bilateral cerebellar hemispheres. No cortical encephalomalacia is identified. There is no acute infarct. No evidence of an intracranial mass. No chronic intracranial blood products. No extra-axial fluid collection. No midline shift. No pathologic intracranial enhancement identified. Vascular: Maintained flow voids within the proximal large arterial vessels. Skull and upper cervical spine: No focal worrisome marrow lesion. Incompletely assessed cervical spondylosis. Sinuses/Orbits: No mass or acute finding within the imaged orbits. Minimal mucosal thickening within the left frontal sinus. Mild bilateral ethmoid sinusitis. Other: Multiple enhancing parotid lesions bilaterally, the largest on the left measuring 2.4 x 2.0 cm (for instance as seen on series 15, image 11). Impression #3 will be called to the ordering clinician or representative by the  Radiologist Assistant, and communication documented in the PACS or [employer]. IMPRESSION: 1. No evidence of intracranial metastatic disease. 2. 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 3. Multiple enhancing parotid lesions bilaterally, the largest on the left measuring 2.4 x 2.0 cm. These could reflect primary parotid neoplasms and/or abnormal lymph nodes (such as from nodal metastatic disease). Consider direct tissue sampling. 4. Mild paranasal sinus disease. Electronically Signed   By: Rockey Childs D.O.   On: 04/05/2024 15:49   DG Chest Port 1 View Result Date: 03/29/2024 CLINICAL DATA:  Status post bronchoscopy. EXAM: PORTABLE CHEST 1 VIEW COMPARISON:  Chest CT dated 03/13/2024. FINDINGS: No pneumothorax post bronchoscopy. Right upper lobe mass. There is background of emphysema and chronic interstitial coarsening. No pleural effusion pneumothorax. The cardiac silhouette is within normal limits. Atherosclerotic calcification of the aorta. No acute osseous pathology. IMPRESSION: 1. No pneumothorax post bronchoscopy. 2. Right upper lobe mass. Electronically Signed   By: Vanetta Chou M.D.   On: 03/29/2024 11:28   DG C-ARM BRONCHOSCOPY Result Date: 03/29/2024 C-ARM BRONCHOSCOPY: Fluoroscopy was utilized by the requesting physician.  No radiographic interpretation.   NM PET Image Initial (PI) Skull Base To Thigh Result Date: 03/28/2024 CLINICAL DATA:  Initial treatment strategy for lung nodule. EXAM: NUCLEAR MEDICINE PET SKULL BASE TO THIGH TECHNIQUE: 6.83 mCi F-18 FDG was injected intravenously. Full-ring PET imaging was performed from the skull base to thigh after the radiotracer. CT data was obtained and used for attenuation correction and anatomic localization. Fasting blood glucose: 87 mg/dl COMPARISON:  Noncontrast CT 03/13/2024. FINDINGS: Mediastinal blood pool activity: SUV max 2.2 Liver activity: SUV max 3.2 NECK: There are bilateral  hypermetabolic parotid lesions which could be nodes versus intrinsic parotid masses. Example on the left posteroinferior has maximum SUV of 14.3 and dimension on series 4, image 21 2.1 by 1.9 cm. There are 3 such left-sided parotid lesions. On the right side there is an intrinsic parotid lesion as well on CT image 19 measuring 12 by 10 mm and has maximum SUV of 5.2. There is also some adjacent abnormal nodes. On the right side just posteroinferior to the parotid gland are 2 such nodes present largest has maximum SUV of 10.4 and dimension on image 26 of series 4 measuring 16 by 13 mm. Left-sided focus just inferior to the parotid gland on image 29 measures 13 by 6 mm and has maximum SUV of 6.6. No other areas of abnormal uptake in the neck. Near symmetric uptake of the visualized portions of the intracranial compartment. Incidental CT findings: Mastoid air cells  are clear. There is some mild opacity along the anterior ethmoid air cells. Prominent vascular calcifications in the neck. The submandibular glands and thyroid gland are grossly preserved. CHEST: Heterogeneous mass identified right upper lobe as seen on the prior CT scan of the chest. On the prior CT this measured 5.0 x 3.9 cm with some satellite nodules. The main lesion today has maximum SUV 16.4 consistent with neoplasm. The small satellite areas not show clear significant abnormal uptake. No additional areas of abnormal uptake along the lung parenchyma. There is only minimal uptake along the right lung hilum with maximum SUV of 3.3. No discrete abnormal nodal uptake above blood pool otherwise along the axillary region, hilum or mediastinum. Incidental CT findings: Heart nonenlarged. No pericardial effusion. Prominent vascular calcifications along the thoracic aorta, great vessels and coronary arteries. Normal caliber thoracic esophagus. Breathing motion. Emphysematous lung changes identified. No pneumothorax or effusion. Once again there is a chest wall  lesion on the right side which is somewhat cystic and does not show abnormal uptake. Favor benign lesion. ABDOMEN/PELVIS: No abnormal hypermetabolic activity within the liver, pancreas, adrenal glands, or spleen. No hypermetabolic lymph nodes in the abdomen or pelvis. Incidental CT findings: Distended gallbladder with some layering stones. On this limited noncontrast attenuation correction CT, grossly the liver, spleen, adrenal glands and pancreas are preserved. Mild renal atrophy with some nonspecific perinephric stranding. No renal or ureteral stones. Underdistended urinary bladder. Extensive left-sided colonic diverticulosis. The bowel is nondilated. Air-fluid level along the stomach. Extensive vascular calcifications. SKELETON: No focal hypermetabolic activity to suggest skeletal metastasis. Incidental CT findings: Scattered degenerative changes. IMPRESSION: Hypermetabolic right upper lobe lung mass again seen consistent with neoplasm until proven otherwise. No additional areas of abnormal uptake along the chest including along lymph nodes. Only slight uptake along the right lung hilum. Multiple areas of abnormal uptake in the neck including involving the parotid glands and adjacent lymph nodes. The intrinsic parotid lesions at hypermetabolic could be intraparotid lymph nodes versus intrinsic mass lesions. Please correlate for any known history or dedicated further workup is recommended. Gallstones.  Colonic diverticulosis. Electronically Signed   By: Ranell Bring M.D.   On: 03/28/2024 14:25   CT CHEST WO CONTRAST Result Date: 03/15/2024 CLINICAL DATA:  Everyday smoker, denies chest complaints. * Tracking Code: BO * EXAM: CT CHEST WITHOUT CONTRAST TECHNIQUE: Multidetector CT imaging of the chest was performed following the standard protocol without IV contrast. RADIATION DOSE REDUCTION: This exam was performed according to the departmental dose-optimization program which includes automated exposure control,  adjustment of the mA and/or kV according to patient size and/or use of iterative reconstruction technique. COMPARISON:  None Available. FINDINGS: Cardiovascular: Aortic atherosclerosis. Three-vessel coronary artery calcifications/stents. Normal size heart. No significant pericardial effusion/thickening. Mediastinum/Nodes: Prominent mediastinal lymph nodes for instance a precarinal lymph node measuring 9 mm in short axis on image 66/2. Right hilar fullness may reflect enlarged lymph nodes versus overlapping vascular structures but not definitively delineated on this noncontrast enhanced examination. Lungs/Pleura: Spiculated right upper lobe mass with adjacent satellite nodules and architectural distortion measures 5.0 x 3.9 cm on image 55/3. There is some adjacent interlobular septal thickening and ground-glass for instance on image 63/3. Paraseptal and centrilobular emphysema. Bibasilar subpleural reticulations with honeycombing. Mild diffuse bronchial wall thickening. Adherent debris in the trachea. Upper Abdomen: No suspicious adrenal nodule/mass. Musculoskeletal: Well-circumscribed 6.3 cm mass in the subcutaneous tissues along the right posterior chest wall on image 115/2 measures Hounsfield units greater than that of simple fluid. Diffuse  demineralization of bone. Multilevel degenerative change of the spine. IMPRESSION: 1. Spiculated right upper lobe mass with adjacent satellite nodules and architectural distortion measures 5.0 x 3.9 cm, highly suspicious for primary bronchogenic neoplasm. Suggest pulmonary consultation. 2. Prominent mediastinal lymph nodes and right hilar fullness may reflect enlarged lymph nodes versus overlapping vascular structures but not definitively delineated on this noncontrast enhanced examination. 3. Well-circumscribed 6.3 cm mass in the subcutaneous tissues along the right posterior chest wall measures Hounsfield units greater than that of simple fluid. Likely a large sebaceous cysts  consider further evaluation by dedicated ultrasound. 4. Bibasilar subpleural reticulations with honeycombing, suggestive of interstitial lung disease. 5. Aortic Atherosclerosis (ICD10-I70.0) and Emphysema (ICD10-J43.9). These results will be called to the ordering clinician or representative by the Radiologist Assistant, and communication documented in the PACS or [employer]. Electronically Signed   By: Reyes Holder M.D.   On: 03/15/2024 16:15    ASSESSMENT: This is a very pleasant 83 years old white male with likely stage IIb/IV (T2b, N1, M0/M1 a) non-small cell lung cancer, squamous cell carcinoma presented with large right upper lobe lung mass in addition to right hilar lymphadenopathy seen on the PET scan.  His previous CT scan of the chest showed satellite pulmonary nodules in addition to suspicious mediastinal lymphadenopathy that were not active on the PET scan.  This was diagnosed in July 2025.  He also has several parotid gland hypermetabolic lesions likely a different process but metastatic disease could not be completely excluded.   PLAN: I had a lengthy discussion with the patient and his daughter today about his current disease stage, prognosis and treatment options.  I personally and independently reviewed the scan images and discussed the result with the patient and his daughter.  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 PET scan indicates no significant mediastinal lymph node involvement, suggesting stage 2B classification. Primary treatment goal is curative. - Coordinate with Dr. Kerrin for surgical evaluation on August 15. - If surgery is feasible, consider neoadjuvant chemotherapy and immunotherapy prior to surgery. - If surgery is not feasible, refer to a radiation oncologist for chemoradiation  therapy.  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 - Arrange biopsy of parotid gland nodules to assess for malignancy.  Nicotine dependence, current smoker 70-year smoking history, currently smoking 6-7 cigarettes per day. Smoking cessation is critical, especially in the context of lung cancer treatment.  Unintentional weight loss 10-pound unintentional weight loss over the past year, likely due to decreased appetite, possibly related to underlying malignancy or other comorbid conditions. I will see him back for follow-up visit in around 2-3 weeks for evaluation more detailed discussion of his treatment options after the surgical evaluation. The patient was advised to call immediately if he has any other concerning symptoms in the interval.  The patient voices understanding of current disease status and treatment options and is in agreement with the current care plan.  All questions were answered. The patient knows to call the clinic with any problems, questions or concerns. We can certainly see the patient much sooner if necessary.  Thank you so much for allowing me to participate in the care of Eric Ochoa. I will continue to follow up the patient  with you and assist in his care. The total time spent in the appointment was 90 minutes including review of chart and various tests results, discussions about plan of care and coordination of care plan .   Disclaimer: This note was dictated with voice recognition software. Similar sounding words can inadvertently be transcribed and may not be corrected upon review.   Sherrod MARLA Sherrod April 09, 2024, 12:55 PM

## 2024-04-11 ENCOUNTER — Ambulatory Visit: Admitting: Oncology

## 2024-04-11 ENCOUNTER — Encounter: Payer: Self-pay | Admitting: *Deleted

## 2024-04-11 ENCOUNTER — Encounter (HOSPITAL_COMMUNITY): Payer: Self-pay

## 2024-04-11 ENCOUNTER — Telehealth: Payer: Self-pay | Admitting: *Deleted

## 2024-04-11 NOTE — Telephone Encounter (Signed)
 LMOVM to verify card hx.

## 2024-04-11 NOTE — Progress Notes (Signed)
 Eric Ochoa POUR, MD  Eric Ochoa Approved for US  guided core biopsy of LEFT PAROTID mass.  No sedation needed.  HKM       Previous Messages    ----- Message ----- From: Mariyam Remington Sent: 04/09/2024   4:32 PM EDT To: Seleena Reimers; Ir Procedure Requests Subject: US  CORE BIOPSY (SALIVARY GLAND/PAROTID GLAND)  Procedure : US  CORE BIOPSY (SALIVARY GLAND/PAROTID GLAND)  Reason: US  guided core biopsy of hypermetabolic left adrenal gland lesion Dx: Primary squamous cell carcinoma of upper lobe of right lung (HCC) [C34.11 (ICD-10-CM)]    History : MR brain w/wo , DG chest port 1 view , NM PET initial skull base to thigh, CT Chest wo  Provider : Sherrod Sherrod, MD  Contact : [phone]

## 2024-04-12 ENCOUNTER — Other Ambulatory Visit: Payer: Self-pay

## 2024-04-12 ENCOUNTER — Ambulatory Visit (HOSPITAL_COMMUNITY)
Admission: RE | Admit: 2024-04-12 | Discharge: 2024-04-12 | Disposition: A | Discharge status: Home / Self Care | Source: Ambulatory Visit | Attending: Cardiology | Admitting: Cardiology

## 2024-04-12 ENCOUNTER — Encounter: Payer: Self-pay | Admitting: Cardiovascular Disease

## 2024-04-12 ENCOUNTER — Ambulatory Visit: Admitting: Cardiovascular Disease

## 2024-04-12 VITALS — BP 126/80 | HR 67 | Ht 69.0 in | Wt 140.8 lb

## 2024-04-12 DIAGNOSIS — I359 Nonrheumatic aortic valve disorder, unspecified: Secondary | ICD-10-CM

## 2024-04-12 DIAGNOSIS — Z01818 Encounter for other preprocedural examination: Secondary | ICD-10-CM | POA: Insufficient documentation

## 2024-04-12 DIAGNOSIS — R809 Proteinuria, unspecified: Secondary | ICD-10-CM | POA: Diagnosis not present

## 2024-04-12 DIAGNOSIS — I10 Essential (primary) hypertension: Secondary | ICD-10-CM | POA: Diagnosis not present

## 2024-04-12 DIAGNOSIS — E211 Secondary hyperparathyroidism, not elsewhere classified: Secondary | ICD-10-CM | POA: Diagnosis not present

## 2024-04-12 DIAGNOSIS — R0602 Shortness of breath: Secondary | ICD-10-CM

## 2024-04-12 DIAGNOSIS — R918 Other nonspecific abnormal finding of lung field: Secondary | ICD-10-CM | POA: Diagnosis not present

## 2024-04-12 DIAGNOSIS — E785 Hyperlipidemia, unspecified: Secondary | ICD-10-CM | POA: Insufficient documentation

## 2024-04-12 DIAGNOSIS — Z720 Tobacco use: Secondary | ICD-10-CM | POA: Insufficient documentation

## 2024-04-12 DIAGNOSIS — Z01810 Encounter for preprocedural cardiovascular examination: Secondary | ICD-10-CM | POA: Diagnosis not present

## 2024-04-12 DIAGNOSIS — D631 Anemia in chronic kidney disease: Secondary | ICD-10-CM | POA: Diagnosis not present

## 2024-04-12 DIAGNOSIS — N189 Chronic kidney disease, unspecified: Secondary | ICD-10-CM | POA: Diagnosis not present

## 2024-04-12 LAB — ECHOCARDIOGRAM COMPLETE
AR max vel: 0.95 cm2
AV Area VTI: 0.93 cm2
AV Area mean vel: 0.85 cm2
AV Mean grad: 15 mmHg
AV Peak grad: 26.2 mmHg
Ao pk vel: 2.56 m/s
Area-P 1/2: 2.24 cm2
S' Lateral: 3.1 cm

## 2024-04-12 NOTE — Progress Notes (Signed)
 Cardiology Office Note   Date:  04/12/2024  ID:  Eric Ochoa, DOB October 14, 1940, MRN [id number]  PCP:  Shona Norleen PEDLAR, MD  Cardiologist:   Deatrice Cage, MD   Chief Complaint  Patient presents with   New Patient (Initial Visit)    Preoperative cardiovascular evaluation.         History of Present Illness: Eric Ochoa is a 83 y.o. male who was referred for preoperative cardiovascular evaluation.  He has known history of tobacco use, type 2 diabetes, stage IIIa chronic kidney disease, essential hypertension, tobacco use, hyperlipidemia and GERD.  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  The plan is to proceed with surgical resection. The patient has prolonged history of diabetes mellitus for at least 20 years.  He also has a long history of tobacco use.  Although he denies chest pain, he has significant exertional dyspnea.  He does some housework but no other extensive physical activities.  He had an echocardiogram done today which showed normal LV systolic function with moderately calcified aortic valve.  There was moderate aortic stenosis with mean gradient of 15 mmHg and valve area of 0.95 cm.  His recent CT chest was personally reviewed by me and shows extensive coronary artery calcifications as well as aortic calcifications.  Past Medical History:  Diagnosis Date   Abnormal results of kidney function studies    Abnormal weight loss    Disorder of kidney and ureter    Essential hypertension    Hereditary and idiopathic neuropathy    Hypercalcemia    Iron deficiency anemia    Mixed hyperlipidemia    Mononeuritis multiplex    Tobacco use    Type 2 diabetes mellitus (HCC)     Past Surgical History:  Procedure Laterality Date   COLONOSCOPY N/A 11/01/2016   Procedure: COLONOSCOPY;  Surgeon: Margo LITTIE Haddock, MD;  Location: [facility];  Service: Endoscopy;  Laterality: N/A;  1:45pm   ENDOBRONCHIAL ULTRASOUND Bilateral 03/29/2024   Procedure:  ENDOBRONCHIAL ULTRASOUND (EBUS);  Surgeon: Isadora Hose, MD;  Location: [facility] ENDOSCOPY;  Service: Pulmonary;  Laterality: Bilateral;   ESOPHAGOGASTRODUODENOSCOPY N/A 11/01/2016   Procedure: ESOPHAGOGASTRODUODENOSCOPY (EGD);  Surgeon: Margo LITTIE Haddock, MD;  Location: [facility];  Service: Endoscopy;  Laterality: N/A;   LAPAROSCOPIC APPENDECTOMY N/A 1975   VIDEO BRONCHOSCOPY WITH ENDOBRONCHIAL NAVIGATION Bilateral 03/29/2024   Procedure: VIDEO BRONCHOSCOPY WITH ENDOBRONCHIAL NAVIGATION;  Surgeon: Isadora Hose, MD;  Location: [facility] ENDOSCOPY;  Service: Pulmonary;  Laterality: Bilateral;     Current Outpatient Medications  Medication Sig Dispense Refill   amLODipine (NORVASC) 10 MG tablet Take 10 mg by mouth daily.      amoxicillin  (AMOXIL ) 500 MG tablet 2 PO BID FOR 10 DAYS 40 tablet 0   aspirin EC 81 MG tablet Take 81 mg by mouth daily.     atorvastatin (LIPITOR) 40 MG tablet Take 40 mg by mouth daily.     chlorhexidine  (HIBICLENS ) 4 % external liquid Apply topically daily as needed. 236 mL 0   Choline Fenofibrate (FENOFIBRIC ACID) 135 MG CPDR Take 135 mg by mouth daily.      ezetimibe (ZETIA) 10 MG tablet Take 1 tablet by mouth daily.     FARXIGA 10 MG TABS tablet TAKE 1 TABLET BY MOUTH DAILY FOR PROTEIN IN URINE     ferrous sulfate 325 (65 FE) MG tablet Take 325 mg by mouth daily with breakfast.     gabapentin (NEURONTIN) 800 MG  tablet Take 800 mg by mouth at bedtime.      Iron-FA-B Cmp-C-Biot-Probiotic (FUSION PLUS) CAPS Take 1 capsule by mouth daily.     losartan (COZAAR) 100 MG tablet Take 100 mg by mouth daily.      metFORMIN (GLUCOPHAGE) 1000 MG tablet Take 1,000 mg by mouth daily with breakfast.      omeprazole  (PRILOSEC) 20 MG capsule TAKE ONE CAPSULE BY MOUTH 30 MINUTES PRIOR TO BREAKFAST AND SUPPER 180 capsule 0   simvastatin (ZOCOR) 40 MG tablet Take 40 mg by mouth daily.      cephALEXin  (KEFLEX ) 500 MG capsule Take 1 capsule (500 mg total) by mouth 2 (two) times daily. (Patient not  taking: Reported on 04/12/2024) 14 capsule 0   clarithromycin  (BIAXIN ) 500 MG tablet 1 PO BID FOR 10 DAYS. (Patient not taking: Reported on 04/12/2024) 20 tablet 0   mupirocin  ointment (BACTROBAN ) 2 % Apply 1 Application topically daily. (Patient not taking: Reported on 04/12/2024) 22 g 0   No current facility-administered medications for this visit.    Allergies:   Patient has no known allergies.    Social History:  The patient  reports that he has been smoking cigarettes. He started smoking about 2 years ago. He has a 23.8 pack-year smoking history. He has never used smokeless tobacco. He reports that he does not drink alcohol and does not use drugs.   Family History:  The patient's family history is not on file.    ROS:  Please see the history of present illness.   Otherwise, review of systems are positive for none.   All other systems are reviewed and negative.    PHYSICAL EXAM: VS:  BP 122/80 (BP Location: Left Arm, Patient Position: Sitting, Cuff Size: Normal)   Ht 5' 9 (1.753 m)   Wt 140 lb 12.8 oz (63.9 kg)   SpO2 97%   BMI 20.79 kg/m  , BMI Body mass index is 20.79 kg/m. GEN: Well nourished, well developed, in no acute distress  HEENT: normal  Neck: no JVD, carotid bruits, or masses Cardiac: RRR; no  rubs, or gallops,no edema .  3 out of 6 systolic murmur in the aortic area which is mid peaking.  S2 is well-preserved. Respiratory:  clear to auscultation bilaterally, normal work of breathing GI: soft, nontender, nondistended, + BS MS: no deformity or atrophy  Skin: warm and dry, no rash Neuro:  Strength and sensation are intact Psych: euthymic mood, full affect   EKG:  EKG is ordered today. The ekg ordered today demonstrates : Normal sinus rhythm   Recent Labs: 04/09/2024: ALT 18; BUN 17; Creatinine 1.39; Hemoglobin 10.8; Platelet Count 267; Potassium 4.6; Sodium 138    Lipid Panel    Component Value Date/Time   CHOL 179 04/09/2009 2226   TRIG 313 (H)  04/09/2009 2226   HDL 29 (L) 04/09/2009 2226   CHOLHDL 6.2 Ratio 04/09/2009 2226   VLDL 63 (H) 04/09/2009 2226   LDLCALC 87 04/09/2009 2226      Wt Readings from Last 3 Encounters:  04/12/24 140 lb 12.8 oz (63.9 kg)  04/09/24 138 lb (62.6 kg)  04/04/24 142 lb (64.4 kg)          04/12/2024    4:24 PM  PAD Screen  Previous PAD dx? No  Previous surgical procedure? No  Pain with walking? No  Feet/toe relief with dangling? No  Painful, non-healing ulcers? No  Extremities discolored? No      ASSESSMENT AND PLAN:  1.  Preop cardiovascular evaluation for lung cancer resection: The patient has borderline functional capacity.  Fortunately, his EKG does not show ischemic changes.  However, his CT chest shows extensive coronary artery calcifications as well as aortic calcifications.  His symptoms include exertional dyspnea without chest pain.  Given his prolonged history of diabetes mellitus, I am concerned that his exertional dyspnea represent anginal equivalent. Recommend evaluation with a Lexiscan  Myoview  to evaluate for obstructive coronary artery disease. In addition, he does have underlying moderate aortic stenosis.  This puts him also at higher risk than the average but by itself does not require intervention at this time.  2.  Aortic stenosis: Moderate by echo.  Recommend repeat echocardiogram in 1 year.  3.  Tobacco use: I discussed the importance of smoking cessation and he already started cutting down.  4.  Essential hypertension: Blood pressure is controlled.  5.  Hyperlipidemia: Currently on atorvastatin 40 mg daily.    Disposition:   FU in 3 months.  Signed,  Deatrice Cage, MD  04/12/2024 4:51 PM    [facility]

## 2024-04-12 NOTE — Progress Notes (Signed)
 The proposed treatment discussed in conference is for discussion purpose only and is not a binding recommendation.  The patients have not been physically examined, or presented with their treatment options.  Therefore, final treatment plans cannot be decided.

## 2024-04-12 NOTE — Patient Instructions (Signed)
 Medication Instructions:  No changes *If you need a refill on your cardiac medications before your next appointment, please call your pharmacy*  Lab Work: None ordered If you have labs (blood work) drawn today and your tests are completely normal, you will receive your results only by: MyChart Message (if you have MyChart) OR A paper copy in the mail If you have any lab test that is abnormal or we need to change your treatment, we will call you to review the results.  Testing/Procedures: Your provider has ordered a Lexiscan / Exercise Myoview  Stress test. This will take place at [facility]. Please report to the [facility] medical mall entrance. The volunteers at the first desk will direct you where to go.  [facility] MYOVIEW   Your provider has ordered a Stress Test with nuclear imaging. The purpose of this test is to evaluate the blood supply to your heart muscle. This procedure is referred to as a Non-Invasive Stress Test. This is because other than having an IV started in your vein, nothing is inserted or invades your body. Cardiac stress tests are done to find areas of poor blood flow to the heart by determining the extent of coronary artery disease (CAD). Some patients exercise on a treadmill, which naturally increases the blood flow to your heart, while others who are unable to walk on a treadmill due to physical limitations will have a pharmacologic/chemical stress agent called Lexiscan  . This medicine will mimic walking on a treadmill by temporarily increasing your coronary blood flow.   Please note: these test may take anywhere between 2-4 hours to complete  How to prepare for your Myoview  test:  Nothing to eat for 6 hours prior to the test No caffeine for 24 hours prior to test No smoking 24 hours prior to test. Your medication may be taken with water .  If your doctor stopped a medication because of this test, do not take that medication. Ladies, please do not wear dresses.  Skirts or pants are  appropriate. Please wear a short sleeve shirt. No perfume, cologne or lotion. Wear comfortable walking shoes. No heels!   PLEASE NOTIFY THE OFFICE AT LEAST 24 HOURS IN ADVANCE IF YOU ARE UNABLE TO KEEP YOUR APPOINTMENT.  [phone] AND  PLEASE NOTIFY NUCLEAR MEDICINE AT [facility] AT LEAST 24 HOURS IN ADVANCE IF YOU ARE UNABLE TO KEEP YOUR APPOINTMENT. [phone]   Follow-Up: At [facility], you and your health needs are our priority.  As part of our continuing mission to provide you with exceptional heart care, our providers are all part of one team.  This team includes your primary Cardiologist (physician) and Advanced Practice Providers or APPs (Physician Assistants and Nurse Practitioners) who all work together to provide you with the care you need, when you need it.  Your next appointment:   3 month(s)  Provider:   You may see Dr. Darron or one of the following Advanced Practice Providers on your designated Care Team:   Lonni Meager, NP Lesley Maffucci, PA-C Bernardino Bring, PA-C Cadence [place], PA-C Tylene Lunch, NP Barnie Hila, NP    We recommend signing up for the patient portal called MyChart.  Sign up information is provided on this After Visit Summary.  MyChart is used to connect with patients for Virtual Visits (Telemedicine).  Patients are able to view lab/test results, encounter notes, upcoming appointments, etc.  Non-urgent messages can be sent to your provider as well.   To learn more about what you can do with MyChart, go to  [email or web address].

## 2024-04-13 ENCOUNTER — Ambulatory Visit: Admitting: Thoracic Surgery (Cardiothoracic Vascular Surgery)

## 2024-04-16 ENCOUNTER — Ambulatory Visit
Admission: RE | Admit: 2024-04-16 | Discharge: 2024-04-16 | Disposition: A | Discharge status: Home / Self Care | Source: Ambulatory Visit | Attending: Cardiovascular Disease | Admitting: Cardiovascular Disease

## 2024-04-16 ENCOUNTER — Ambulatory Visit: Payer: Self-pay | Admitting: Cardiovascular Disease

## 2024-04-16 DIAGNOSIS — R0602 Shortness of breath: Secondary | ICD-10-CM | POA: Insufficient documentation

## 2024-04-16 DIAGNOSIS — Z01818 Encounter for other preprocedural examination: Secondary | ICD-10-CM | POA: Insufficient documentation

## 2024-04-16 LAB — NM MYOCAR MULTI W/SPECT W/WALL MOTION / EF
Nuc Stress EF: 70 %
Peak HR: 78 {beats}/min
Percent HR: 56 %
Rest HR: 58 {beats}/min
Rest Nuclear Isotope Dose: 10.1 mCi
SDS: 1
SRS: 1
SSS: 4
ST Depression (mm): 0 mm
Stress Nuclear Isotope Dose: 31.7 mCi
TID: 1

## 2024-04-16 MED ORDER — REGADENOSON 0.4 MG/5ML IV SOLN
0.4000 mg | Freq: Once | INTRAVENOUS | Status: AC
  Administered 2024-04-16: 0.4 mg via INTRAVENOUS

## 2024-04-16 MED ORDER — TECHNETIUM TC 99M TETROFOSMIN IV KIT
31.7100 | PACK | Freq: Once | INTRAVENOUS | Status: AC | PRN
  Administered 2024-04-16: 31.71 via INTRAVENOUS

## 2024-04-16 MED ORDER — TECHNETIUM TC 99M TETROFOSMIN IV KIT
10.0000 | PACK | Freq: Once | INTRAVENOUS | Status: AC | PRN
  Administered 2024-04-16: 10.12 via INTRAVENOUS

## 2024-04-18 NOTE — Telephone Encounter (Signed)
Pt calling back regarding test results.

## 2024-04-19 DIAGNOSIS — E1122 Type 2 diabetes mellitus with diabetic chronic kidney disease: Secondary | ICD-10-CM | POA: Diagnosis not present

## 2024-04-19 DIAGNOSIS — C44212 Basal cell carcinoma of skin of right ear and external auricular canal: Secondary | ICD-10-CM | POA: Diagnosis not present

## 2024-04-19 DIAGNOSIS — R809 Proteinuria, unspecified: Secondary | ICD-10-CM | POA: Diagnosis not present

## 2024-04-19 DIAGNOSIS — Z85828 Personal history of other malignant neoplasm of skin: Secondary | ICD-10-CM | POA: Diagnosis not present

## 2024-04-19 DIAGNOSIS — Z08 Encounter for follow-up examination after completed treatment for malignant neoplasm: Secondary | ICD-10-CM | POA: Diagnosis not present

## 2024-04-19 DIAGNOSIS — N1831 Chronic kidney disease, stage 3a: Secondary | ICD-10-CM | POA: Diagnosis not present

## 2024-04-19 DIAGNOSIS — D0439 Carcinoma in situ of skin of other parts of face: Secondary | ICD-10-CM | POA: Diagnosis not present

## 2024-04-19 DIAGNOSIS — E1129 Type 2 diabetes mellitus with other diabetic kidney complication: Secondary | ICD-10-CM | POA: Diagnosis not present

## 2024-04-24 ENCOUNTER — Ambulatory Visit
Attending: Thoracic Surgery (Cardiothoracic Vascular Surgery) | Admitting: Thoracic Surgery (Cardiothoracic Vascular Surgery)

## 2024-04-24 VITALS — BP 121/59 | HR 68 | Resp 20 | Ht 69.0 in | Wt 140.0 lb

## 2024-04-24 DIAGNOSIS — R918 Other nonspecific abnormal finding of lung field: Secondary | ICD-10-CM

## 2024-04-24 NOTE — Progress Notes (Signed)
 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  I saw him in the office in early August.  6-minute walk test was marginal, as were his pulmonary function tests.  He had severe coronary calcification and a heart murmur.  He saw Dr. Darron.  Echocardiogram showed moderate aortic stenosis with a mean gradient of 15 and a valve area of 0.93 cm.  Preserved left ventricular function.  SPECT imaging showed no evidence of ischemia.  He is down to about 3 cigarettes a day.  Appetite remains poor.  Past Medical History:  Diagnosis Date   Abnormal results of kidney function studies    Abnormal weight loss    Disorder of kidney and ureter    Essential hypertension    Hereditary and idiopathic neuropathy    Hypercalcemia    Iron deficiency anemia    Mixed hyperlipidemia    Mononeuritis multiplex    Tobacco use    Type 2 diabetes mellitus (HCC)     Current Outpatient Medications  Medication Sig Dispense Refill   amLODipine (NORVASC) 10 MG tablet Take 10 mg by mouth daily.      amoxicillin  (AMOXIL ) 500 MG tablet 2 PO BID FOR 10 DAYS 40 tablet 0   aspirin EC 81 MG tablet Take 81 mg by mouth daily.     atorvastatin (LIPITOR) 40 MG tablet Take 40 mg by mouth daily.     chlorhexidine  (HIBICLENS ) 4 % external liquid Apply topically daily as needed. 236 mL 0   Choline Fenofibrate (FENOFIBRIC ACID) 135 MG CPDR Take 135 mg by mouth daily.      ezetimibe (ZETIA) 10 MG tablet Take 1  tablet by mouth daily.     FARXIGA 10 MG TABS tablet TAKE 1 TABLET BY MOUTH DAILY FOR PROTEIN IN URINE     ferrous sulfate 325 (65 FE) MG tablet Take 325 mg by mouth daily with breakfast.     gabapentin (NEURONTIN) 800 MG tablet Take 800 mg by mouth at bedtime.      Iron-FA-B Cmp-C-Biot-Probiotic (FUSION PLUS) CAPS Take 1 capsule by mouth daily.     losartan (COZAAR) 100 MG tablet Take 100 mg by mouth daily.      metFORMIN (GLUCOPHAGE) 1000 MG tablet Take 1,000 mg by mouth daily with breakfast.      omeprazole  (PRILOSEC) 20 MG capsule TAKE ONE CAPSULE BY MOUTH 30 MINUTES PRIOR TO BREAKFAST AND SUPPER 180 capsule 0   simvastatin (ZOCOR) 40 MG tablet Take 40 mg by mouth daily.      VELTASSA 8.4 g packet 8.4 g.     cephALEXin  (KEFLEX ) 500 MG capsule Take 1 capsule (500 mg total) by mouth 2 (two) times daily. (Patient not taking: Reported on 04/24/2024) 14 capsule 0   clarithromycin  (BIAXIN ) 500 MG tablet 1 PO BID FOR 10 DAYS. (Patient not taking: Reported on 04/24/2024) 20 tablet 0   mupirocin  ointment (BACTROBAN ) 2 % Apply 1 Application topically daily. (Patient not taking: Reported on 04/24/2024)  22 g 0   No current facility-administered medications for this visit.    Physical Exam BP (!) 121/59   Pulse 68   Resp 20   Ht 5' 9 (1.753 m)   Wt 140 lb (63.5 kg)   SpO2 95% Comment: RA  BMI 20.67 kg/m  Frail appearing 83 year old man in no acute distress Alert and oriented x 3, no focal motor deficit Lungs faint wheeze on right, clear on left Cardiac regular rate and rhythm with 2/6 systolic murmur Temporal and thenar wasting, skin atrophic, multiple ecchymoses  Diagnostic Tests: I reviewed his CT images.  5 cm right upper lobe lung mass.  Extensive thoracic aortic and coronary calcification.  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  Difficult situation due to his advanced age and multiple comorbidities.  I had a long discussion with Eric Ochoa and his daughter.  I do not think he would do well with surgical resection.  He does not have any one issue that is an absolute contraindication for surgery.  However, he has multiple issues where he is very borderline.  He has severe coronary calcification even though there is no definite ischemia.  He has moderate AS with a valve area of less than a centimeter.  He has protein calorie malnutrition.  He had very marginal performance on the 6-minute walk test.  Marginal diffusion capacity.  In my opinion he is unlikely to do well with a major surgical resection.  I think any survival advantage would be outweighed by risk of mortality or significant morbidity potentially life-changing.  I think a better option in his case would be to do radiation with or without sensitizing chemotherapy.  Plan: Follow-up with Dr. Sherrod as scheduled on 04/26/2024 Will refer to radiation oncology  I spent over 20 minutes in review of records, imaging, and in discussion with Eric Ochoa and his daughter today. Eric JAYSON Millers, MD [facility] [phone]

## 2024-04-25 NOTE — Progress Notes (Signed)
 Thoracic Location of Tumor / Histology: RUL Lung  Patient presented   Biopsies of      Past/Anticipated interventions by pulmonary, if any:  Past/Anticipated interventions by cardiothoracic surgery, if any:  Dr. Kerrin 04/24/2024 -I do not think he would do well with surgical resection.  -I think a better option in his case would be to do radiation with or without sensitizing chemotherapy.   Past/Anticipated interventions by medical oncology, if any:  Dr. Sherrod 04/09/2024 - If surgery is not feasible, refer to a radiation oncologist for chemoradiation therapy.   Tobacco/Marijuana/Snuff/ETOH use: Current Smoker, 3 cigarettes per day.  Signs/Symptoms Weight changes, if any:  Respiratory complaints, if any:  Hemoptysis, if any:  Pain issues, if any:    SAFETY ISSUES: Prior radiation?  Pacemaker/ICD?   Possible current pregnancy? Is the patient on methotrexate?   Current Complaints / other details:

## 2024-04-26 ENCOUNTER — Inpatient Hospital Stay (HOSPITAL_BASED_OUTPATIENT_CLINIC_OR_DEPARTMENT_OTHER): Admitting: Internal Medicine

## 2024-04-26 ENCOUNTER — Inpatient Hospital Stay

## 2024-04-26 ENCOUNTER — Telehealth: Payer: Self-pay | Admitting: Internal Medicine

## 2024-04-26 ENCOUNTER — Ambulatory Visit
Admission: RE | Admit: 2024-04-26 | Discharge: 2024-04-26 | Disposition: A | Discharge status: Home / Self Care | Source: Ambulatory Visit | Attending: Radiation Oncology | Admitting: Radiation Oncology

## 2024-04-26 ENCOUNTER — Encounter: Payer: Self-pay | Admitting: Radiation Oncology

## 2024-04-26 VITALS — BP 129/57 | HR 64 | Temp 96.6°F | Resp 17 | Ht 69.0 in | Wt 141.0 lb

## 2024-04-26 VITALS — Wt 141.0 lb

## 2024-04-26 DIAGNOSIS — C3411 Malignant neoplasm of upper lobe, right bronchus or lung: Secondary | ICD-10-CM

## 2024-04-26 DIAGNOSIS — C3490 Malignant neoplasm of unspecified part of unspecified bronchus or lung: Secondary | ICD-10-CM | POA: Diagnosis not present

## 2024-04-26 DIAGNOSIS — Z7982 Long term (current) use of aspirin: Secondary | ICD-10-CM | POA: Diagnosis not present

## 2024-04-26 DIAGNOSIS — F17210 Nicotine dependence, cigarettes, uncomplicated: Secondary | ICD-10-CM | POA: Diagnosis not present

## 2024-04-26 DIAGNOSIS — Z79899 Other long term (current) drug therapy: Secondary | ICD-10-CM | POA: Diagnosis not present

## 2024-04-26 LAB — CBC WITH DIFFERENTIAL (CANCER CENTER ONLY)
Abs Immature Granulocytes: 0.01 K/uL (ref 0.00–0.07)
Basophils Absolute: 0.1 K/uL (ref 0.0–0.1)
Basophils Relative: 2 %
Eosinophils Absolute: 0.4 K/uL (ref 0.0–0.5)
Eosinophils Relative: 6 %
HCT: 33.8 % — ABNORMAL LOW (ref 39.0–52.0)
Hemoglobin: 11 g/dL — ABNORMAL LOW (ref 13.0–17.0)
Immature Granulocytes: 0 %
Lymphocytes Relative: 27 %
Lymphs Abs: 1.6 K/uL (ref 0.7–4.0)
MCH: 26.9 pg (ref 26.0–34.0)
MCHC: 32.5 g/dL (ref 30.0–36.0)
MCV: 82.6 fL (ref 80.0–100.0)
Monocytes Absolute: 0.5 K/uL (ref 0.1–1.0)
Monocytes Relative: 8 %
Neutro Abs: 3.5 K/uL (ref 1.7–7.7)
Neutrophils Relative %: 57 %
Platelet Count: 244 K/uL (ref 150–400)
RBC: 4.09 MIL/uL — ABNORMAL LOW (ref 4.22–5.81)
RDW: 16.2 % — ABNORMAL HIGH (ref 11.5–15.5)
WBC Count: 6.1 K/uL (ref 4.0–10.5)
nRBC: 0 % (ref 0.0–0.2)

## 2024-04-26 LAB — CMP (CANCER CENTER ONLY)
ALT: 18 U/L (ref 0–44)
AST: 19 U/L (ref 15–41)
Albumin: 3.9 g/dL (ref 3.5–5.0)
Alkaline Phosphatase: 80 U/L (ref 38–126)
Anion gap: 6 (ref 5–15)
BUN: 15 mg/dL (ref 8–23)
CO2: 26 mmol/L (ref 22–32)
Calcium: 9.3 mg/dL (ref 8.9–10.3)
Chloride: 111 mmol/L (ref 98–111)
Creatinine: 1.47 mg/dL — ABNORMAL HIGH (ref 0.61–1.24)
GFR, Estimated: 47 mL/min — ABNORMAL LOW (ref >60)
Glucose, Bld: 96 mg/dL (ref 70–99)
Potassium: 4.2 mmol/L (ref 3.5–5.1)
Sodium: 143 mmol/L (ref 135–145)
Total Bilirubin: 0.3 mg/dL (ref 0.0–1.2)
Total Protein: 7.2 g/dL (ref 6.5–8.1)

## 2024-04-26 NOTE — Progress Notes (Signed)
 [facility] [phone]   [phone]  OFFICE PROGRESS NOTE  Eric Norleen PEDLAR, MD [address] [place] [id number]  DIAGNOSIS: stage IIb/IV (T2b, N1, M0/M1 a) non-small cell lung cancer, squamous cell carcinoma presented with large right upper lobe lung mass in addition to right hilar lymphadenopathy seen on the PET scan.  His previous CT scan of the chest showed satellite pulmonary nodules in addition to suspicious mediastinal lymphadenopathy that were not active on the PET scan.  This was diagnosed in July 2025.  He also has several parotid gland hypermetabolic lesions likely a different process but metastatic disease could not be completely excluded.   PRIOR THERAPY: None  CURRENT THERAPY:  INTERVAL HISTORY: Eric Ochoa 83 y.o. male returns to the clinic today for  Discussed the use of AI scribe software for clinical note transcription with the patient, who gave verbal consent to proceed.  History of Present Illness Eric Ochoa is an 83 year old male with non-small cell lung cancer who presents for re-evaluation and discussion of treatment options. He is accompanied by his daughter, who is his primary caregiver.  He has a history of non-small cell lung cancer, specifically squamous cell carcinoma, with a large right upper lobe lung mass and right hilar lymphadenopathy. Satellite nodules were identified but were not active on the PET scan. He was previously evaluated by cardiothoracic surgery, and surgery was not pursued.  He has no new complaints since his last visit. No chest pain, coughing, nausea, vomiting, or diarrhea. He is concerned about the side effects of chemotherapy, particularly hair loss, nausea, vomiting, and fatigue.  His daughter has discussed the logistics and challenges of traveling for treatment, particularly the daily travel required for radiation therapy. He resides in [place] but has a strong connection to [place],  where he spent much of his life. He does not have much contact with family and is not aware of detailed family medical history.  The daughter mentioned that there are some issues between her and the O[place] in [place] that she would prefer for him not to receive the treatment in [place].     MEDICAL HISTORY: Past Medical History:  Diagnosis Date   Abnormal results of kidney function studies    Abnormal weight loss    Disorder of kidney and ureter    Essential hypertension    Hereditary and idiopathic neuropathy    Hypercalcemia    Iron deficiency anemia    Mixed hyperlipidemia    Mononeuritis multiplex    Tobacco use    Type 2 diabetes mellitus (HCC)     ALLERGIES:  has no known allergies.  MEDICATIONS:  Current Outpatient Medications  Medication Sig Dispense Refill   amLODipine (NORVASC) 10 MG tablet Take 10 mg by mouth daily.      amoxicillin  (AMOXIL ) 500 MG tablet 2 PO BID FOR 10 DAYS 40 tablet 0   aspirin EC 81 MG tablet Take 81 mg by mouth daily.     atorvastatin (LIPITOR) 40 MG tablet Take 40 mg by mouth daily.     cephALEXin  (KEFLEX ) 500 MG capsule Take 1 capsule (500 mg total) by mouth 2 (two) times daily. (Patient not taking: Reported on 04/24/2024) 14 capsule 0   chlorhexidine  (HIBICLENS ) 4 % external liquid Apply topically daily as needed. 236 mL 0   Choline Fenofibrate (FENOFIBRIC ACID) 135 MG CPDR Take 135 mg by mouth daily.      clarithromycin  (BIAXIN ) 500 MG  tablet 1 PO BID FOR 10 DAYS. (Patient not taking: Reported on 04/24/2024) 20 tablet 0   ezetimibe (ZETIA) 10 MG tablet Take 1 tablet by mouth daily.     FARXIGA 10 MG TABS tablet TAKE 1 TABLET BY MOUTH DAILY FOR PROTEIN IN URINE     ferrous sulfate 325 (65 FE) MG tablet Take 325 mg by mouth daily with breakfast.     gabapentin (NEURONTIN) 800 MG tablet Take 800 mg by mouth at bedtime.      Iron-FA-B Cmp-C-Biot-Probiotic (FUSION PLUS) CAPS Take 1 capsule by mouth daily.     losartan (COZAAR) 100 MG tablet Take 100 mg  by mouth daily.      metFORMIN (GLUCOPHAGE) 1000 MG tablet Take 1,000 mg by mouth daily with breakfast.      mupirocin  ointment (BACTROBAN ) 2 % Apply 1 Application topically daily. (Patient not taking: Reported on 04/24/2024) 22 g 0   omeprazole  (PRILOSEC) 20 MG capsule TAKE ONE CAPSULE BY MOUTH 30 MINUTES PRIOR TO BREAKFAST AND SUPPER 180 capsule 0   simvastatin (ZOCOR) 40 MG tablet Take 40 mg by mouth daily.      VELTASSA 8.4 g packet 8.4 g.     No current facility-administered medications for this visit.    SURGICAL HISTORY:  Past Surgical History:  Procedure Laterality Date   COLONOSCOPY N/A 11/01/2016   Procedure: COLONOSCOPY;  Surgeon: Margo LITTIE Haddock, MD;  Location: [facility];  Service: Endoscopy;  Laterality: N/A;  1:45pm   ENDOBRONCHIAL ULTRASOUND Bilateral 03/29/2024   Procedure: ENDOBRONCHIAL ULTRASOUND (EBUS);  Surgeon: Isadora Hose, MD;  Location: [facility] ENDOSCOPY;  Service: Pulmonary;  Laterality: Bilateral;   ESOPHAGOGASTRODUODENOSCOPY N/A 11/01/2016   Procedure: ESOPHAGOGASTRODUODENOSCOPY (EGD);  Surgeon: Margo LITTIE Haddock, MD;  Location: [facility];  Service: Endoscopy;  Laterality: N/A;   LAPAROSCOPIC APPENDECTOMY N/A 1975   VIDEO BRONCHOSCOPY WITH ENDOBRONCHIAL NAVIGATION Bilateral 03/29/2024   Procedure: VIDEO BRONCHOSCOPY WITH ENDOBRONCHIAL NAVIGATION;  Surgeon: Isadora Hose, MD;  Location: [facility] ENDOSCOPY;  Service: Pulmonary;  Laterality: Bilateral;    REVIEW OF SYSTEMS:  Constitutional: positive for fatigue Eyes: negative Ears, nose, mouth, throat, and face: negative Respiratory: positive for dyspnea on exertion Cardiovascular: negative Gastrointestinal: negative Genitourinary:negative Integument/breast: negative Hematologic/lymphatic: negative Musculoskeletal:negative Neurological: negative Behavioral/Psych: negative Endocrine: negative Allergic/Immunologic: negative   PHYSICAL EXAMINATION: General appearance: alert, cooperative, fatigued, and no  distress Head: Normocephalic, without obvious abnormality, atraumatic Neck: no adenopathy, no JVD, supple, symmetrical, trachea midline, and thyroid not enlarged, symmetric, no tenderness/mass/nodules Lymph nodes: Cervical, supraclavicular, and axillary nodes normal. Resp: clear to auscultation bilaterally Back: symmetric, no curvature. ROM normal. No CVA tenderness. Cardio: regular rate and rhythm, S1, S2 normal, no murmur, click, rub or gallop GI: soft, non-tender; bowel sounds normal; no masses,  no organomegaly Extremities: extremities normal, atraumatic, no cyanosis or edema Neurologic: Alert and oriented X 3, normal strength and tone. Normal symmetric reflexes. Normal coordination and gait  ECOG PERFORMANCE STATUS: 1 - Symptomatic but completely ambulatory  Blood pressure (!) 129/57, pulse 64, temperature (!) 96.6 F (35.9 C), resp. rate 17, height 5' 9 (1.753 m), weight 141 lb (64 kg), SpO2 100%.  LABORATORY DATA: Lab Results  Component Value Date   WBC 6.1 04/26/2024   HGB 11.0 (L) 04/26/2024   HCT 33.8 (L) 04/26/2024   MCV 82.6 04/26/2024   PLT 244 04/26/2024      Chemistry      Component Value Date/Time   NA 143 04/26/2024 0946   K 4.2 04/26/2024 0946  CL 111 04/26/2024 0946   CO2 26 04/26/2024 0946   BUN 15 04/26/2024 0946   CREATININE 1.47 (H) 04/26/2024 0946      Component Value Date/Time   CALCIUM 9.3 04/26/2024 0946   ALKPHOS 80 04/26/2024 0946   AST 19 04/26/2024 0946   ALT 18 04/26/2024 0946   BILITOT 0.3 04/26/2024 0946       RADIOGRAPHIC STUDIES: NM Myocar Multi W/Spect W/Wall Motion / EF Result Date: 04/16/2024   The study is normal. The study is low risk.   No ST deviation was noted.   LV perfusion is normal. There is no evidence of ischemia. There is no evidence of infarction.   Left ventricular function is normal. Nuclear stress EF: 70%. End diastolic cavity size is normal. End systolic cavity size is normal.   ECHOCARDIOGRAM COMPLETE Result  Date: 04/12/2024    ECHOCARDIOGRAM REPORT   Patient Name:   LAYDEN CATERINO Date of Exam: 04/12/2024 Medical Rec #:  [id number]     Height:       72.0 in Accession #:    [id number]    Weight:       138.0 lb Date of Birth:  December 11, 1940     BSA:          1.820 m Patient Age:    83 years      BP:           129/62 mmHg Patient Gender: M             HR:           69 bpm. Exam Location:  [address] Procedure: 2D Echo, 3D Echo, Cardiac Doppler, Color Doppler and Strain Analysis            (Both Spectral and Color Flow Doppler were utilized during            procedure). Indications:    Z01.810 Pre-operative exam  History:        Patient has no prior history of Echocardiogram examinations.                 Lung cancer; Risk Factors:Hypertension, HLD and Diabetes.  Sonographer:    Waldo Guadalajara RCS Referring Phys: [id number] STEVEN C HENDRICKSON IMPRESSIONS  1. Left ventricular ejection fraction, by estimation, is 60 to 65%. Left ventricular ejection fraction by 3D volume is 59 %. The left ventricle has normal function. The left ventricle has no regional wall motion abnormalities. Left ventricular diastolic  parameters are consistent with Grade I diastolic dysfunction (impaired relaxation). The average left ventricular global longitudinal strain is -18.4 %. The global longitudinal strain is normal.  2. Right ventricular systolic function is normal. The right ventricular size is normal. There is normal pulmonary artery systolic pressure.  3. The mitral valve is normal in structure. Trivial mitral valve regurgitation. No evidence of mitral stenosis.  4. The aortic valve is tricuspid. There is moderate calcification of the aortic valve. Aortic valve regurgitation is not visualized. Moderate aortic valve stenosis. Aortic valve area, by VTI measures 0.93 cm. Aortic valve mean gradient measures 15.0 mmHg. Aortic valve Vmax measures 2.56 m/s.  5. Aortic dilatation noted.  6. The inferior vena cava is normal in size with greater than 50%  respiratory variability, suggesting right atrial pressure of 3 mmHg. FINDINGS  Left Ventricle: Left ventricular ejection fraction, by estimation, is 60 to 65%. Left ventricular ejection fraction by 3D volume is 59 %. The left ventricle has normal function. The left ventricle  has no regional wall motion abnormalities. The average left ventricular global longitudinal strain is -18.4 %. Strain was performed and the global longitudinal strain is normal. The left ventricular internal cavity size was normal in size. There is no left ventricular hypertrophy. Left ventricular diastolic parameters are consistent with Grade I diastolic dysfunction (impaired relaxation). Right Ventricle: The right ventricular size is normal. No increase in right ventricular wall thickness. Right ventricular systolic function is normal. There is normal pulmonary artery systolic pressure. The tricuspid regurgitant velocity is 2.45 m/s, and  with an assumed right atrial pressure of 3 mmHg, the estimated right ventricular systolic pressure is 27.0 mmHg. Left Atrium: Left atrial size was normal in size. Right Atrium: Right atrial size was normal in size. Pericardium: There is no evidence of pericardial effusion. Mitral Valve: The mitral valve is normal in structure. Mild mitral annular calcification. Trivial mitral valve regurgitation. No evidence of mitral valve stenosis. Tricuspid Valve: The tricuspid valve is normal in structure. Tricuspid valve regurgitation is trivial. No evidence of tricuspid stenosis. Aortic Valve: The aortic valve is tricuspid. There is moderate calcification of the aortic valve. Aortic valve regurgitation is not visualized. Moderate aortic stenosis is present. Aortic valve mean gradient measures 15.0 mmHg. Aortic valve peak gradient  measures 26.2 mmHg. Aortic valve area, by VTI measures 0.93 cm. Pulmonic Valve: The pulmonic valve was normal in structure. Pulmonic valve regurgitation is not visualized. No evidence of  pulmonic stenosis. Aorta: Aortic dilatation noted. Venous: The inferior vena cava is normal in size with greater than 50% respiratory variability, suggesting right atrial pressure of 3 mmHg. IAS/Shunts: No atrial level shunt detected by color flow Doppler. Additional Comments: 3D was performed not requiring image post processing on an independent workstation and was normal.  LEFT VENTRICLE PLAX 2D LVIDd:         4.30 cm         Diastology LVIDs:         3.10 cm         LV e' medial:    5.00 cm/s LV PW:         1.00 cm         LV E/e' medial:  13.5 LV IVS:        0.90 cm         LV e' lateral:   9.46 cm/s LVOT diam:     1.80 cm         LV E/e' lateral: 7.1 LV SV:         55 LV SV Index:   30              2D Longitudinal LVOT Area:     2.54 cm        Strain                                2D Strain GLS   -18.4 %                                Avg:                                 3D Volume EF  LV 3D EF:    Left                                             ventricul                                             ar                                             ejection                                             fraction                                             by 3D                                             volume is                                             59 %.                                 3D Volume EF:                                3D EF:        59 %                                LV EDV:       112 ml                                LV ESV:       46 ml                                LV SV:        66 ml RIGHT VENTRICLE RV Basal diam:  4.20 cm RV S prime:     9.68 cm/s TAPSE (M-mode): 2.8 cm RVSP:           27.0 mmHg LEFT ATRIUM             Index        RIGHT ATRIUM           Index LA diam:  3.60 cm 1.98 cm/m   RA Pressure: 3.00 mmHg LA Vol (A2C):   64.4 ml 35.39 ml/m  RA Area:     11.80 cm LA Vol (A4C):   26.8 ml 14.73 ml/m  RA Volume:   24.10 ml  13.24 ml/m LA Biplane  Vol: 41.7 ml 22.92 ml/m  AORTIC VALVE AV Area (Vmax):    0.95 cm AV Area (Vmean):   0.85 cm AV Area (VTI):     0.93 cm AV Vmax:           256.00 cm/s AV Vmean:          181.000 cm/s AV VTI:            0.594 m AV Peak Grad:      26.2 mmHg AV Mean Grad:      15.0 mmHg LVOT Vmax:         95.80 cm/s LVOT Vmean:        60.400 cm/s LVOT VTI:          0.218 m LVOT/AV VTI ratio: 0.37  AORTA Ao Root diam: 3.60 cm Ao Asc diam:  3.90 cm MITRAL VALVE               TRICUSPID VALVE MV Area (PHT):             TR Peak grad:   24.0 mmHg MV Decel Time:             TR Vmax:        245.00 cm/s MV E velocity: 67.60 cm/s  Estimated RAP:  3.00 mmHg MV A velocity: 93.30 cm/s  RVSP:           27.0 mmHg MV E/A ratio:  0.72                            SHUNTS                            Systemic VTI:  0.22 m                            Systemic Diam: 1.80 cm Toribio Fuel MD Electronically signed by Toribio Fuel MD Signature Date/Time: 04/12/2024/6:05:02 PM    Final    MR Brain W Wo Contrast Result Date: 04/05/2024 CLINICAL DATA:  Provided history: Lung mass. Metastatic disease evaluation. EXAM: MRI HEAD WITHOUT AND WITH CONTRAST TECHNIQUE: Multiplanar, multiecho pulse sequences of the brain and surrounding structures were obtained without and with intravenous contrast. CONTRAST:  6.4 mL Vueway  intravenous contrast. COMPARISON:  PET CT 03/28/2024. FINDINGS: Brain: Generalized cerebral atrophy. Chronic lacunar infarcts within the bilateral cerebral hemispheric white matter. Moderate multifocal T2 FLAIR hyperintense signal abnormality elsewhere within the cerebral white matter, nonspecific but compatible chronic small vessel ischemic disease. Mild chronic small ischemic changes also present within the pons. Tiny chronic infarcts within the bilateral cerebellar hemispheres. No cortical encephalomalacia is identified. There is no acute infarct. No evidence of an intracranial mass. No chronic intracranial blood products. No extra-axial  fluid collection. No midline shift. No pathologic intracranial enhancement identified. Vascular: Maintained flow voids within the proximal large arterial vessels. Skull and upper cervical spine: No focal worrisome marrow lesion. Incompletely assessed cervical spondylosis. Sinuses/Orbits: No mass or acute finding within the imaged orbits. Minimal mucosal thickening within the left frontal sinus. Mild bilateral ethmoid sinusitis. Other: Multiple  enhancing parotid lesions bilaterally, the largest on the left measuring 2.4 x 2.0 cm (for instance as seen on series 15, image 11). Impression #3 will be called to the ordering clinician or representative by the Radiologist Assistant, and communication documented in the PACS or [employer]. IMPRESSION: 1. No evidence of intracranial metastatic disease. 2. 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 3. Multiple enhancing parotid lesions bilaterally, the largest on the left measuring 2.4 x 2.0 cm. These could reflect primary parotid neoplasms and/or abnormal lymph nodes (such as from nodal metastatic disease). Consider direct tissue sampling. 4. Mild paranasal sinus disease. Electronically Signed   By: Rockey Childs D.O.   On: 04/05/2024 15:49   DG Chest Port 1 View Result Date: 03/29/2024 CLINICAL DATA:  Status post bronchoscopy. EXAM: PORTABLE CHEST 1 VIEW COMPARISON:  Chest CT dated 03/13/2024. FINDINGS: No pneumothorax post bronchoscopy. Right upper lobe mass. There is background of emphysema and chronic interstitial coarsening. No pleural effusion pneumothorax. The cardiac silhouette is within normal limits. Atherosclerotic calcification of the aorta. No acute osseous pathology. IMPRESSION: 1. No pneumothorax post bronchoscopy. 2. Right upper lobe mass. Electronically Signed   By: Vanetta Chou M.D.   On: 03/29/2024 11:28   DG C-ARM BRONCHOSCOPY Result Date: 03/29/2024 C-ARM BRONCHOSCOPY: Fluoroscopy was  utilized by the requesting physician.  No radiographic interpretation.   NM PET Image Initial (PI) Skull Base To Thigh Result Date: 03/28/2024 CLINICAL DATA:  Initial treatment strategy for lung nodule. EXAM: NUCLEAR MEDICINE PET SKULL BASE TO THIGH TECHNIQUE: 6.83 mCi F-18 FDG was injected intravenously. Full-ring PET imaging was performed from the skull base to thigh after the radiotracer. CT data was obtained and used for attenuation correction and anatomic localization. Fasting blood glucose: 87 mg/dl COMPARISON:  Noncontrast CT 03/13/2024. FINDINGS: Mediastinal blood pool activity: SUV max 2.2 Liver activity: SUV max 3.2 NECK: There are bilateral hypermetabolic parotid lesions which could be nodes versus intrinsic parotid masses. Example on the left posteroinferior has maximum SUV of 14.3 and dimension on series 4, image 21 2.1 by 1.9 cm. There are 3 such left-sided parotid lesions. On the right side there is an intrinsic parotid lesion as well on CT image 19 measuring 12 by 10 mm and has maximum SUV of 5.2. There is also some adjacent abnormal nodes. On the right side just posteroinferior to the parotid gland are 2 such nodes present largest has maximum SUV of 10.4 and dimension on image 26 of series 4 measuring 16 by 13 mm. Left-sided focus just inferior to the parotid gland on image 29 measures 13 by 6 mm and has maximum SUV of 6.6. No other areas of abnormal uptake in the neck. Near symmetric uptake of the visualized portions of the intracranial compartment. Incidental CT findings: Mastoid air cells are clear. There is some mild opacity along the anterior ethmoid air cells. Prominent vascular calcifications in the neck. The submandibular glands and thyroid gland are grossly preserved. CHEST: Heterogeneous mass identified right upper lobe as seen on the prior CT scan of the chest. On the prior CT this measured 5.0 x 3.9 cm with some satellite nodules. The main lesion today has maximum SUV 16.4 consistent  with neoplasm. The small satellite areas not show clear significant abnormal uptake. No additional areas of abnormal uptake along the lung parenchyma. There is only minimal uptake along the right lung hilum with maximum SUV of 3.3. No discrete abnormal nodal uptake above blood pool otherwise along the axillary  region, hilum or mediastinum. Incidental CT findings: Heart nonenlarged. No pericardial effusion. Prominent vascular calcifications along the thoracic aorta, great vessels and coronary arteries. Normal caliber thoracic esophagus. Breathing motion. Emphysematous lung changes identified. No pneumothorax or effusion. Once again there is a chest wall lesion on the right side which is somewhat cystic and does not show abnormal uptake. Favor benign lesion. ABDOMEN/PELVIS: No abnormal hypermetabolic activity within the liver, pancreas, adrenal glands, or spleen. No hypermetabolic lymph nodes in the abdomen or pelvis. Incidental CT findings: Distended gallbladder with some layering stones. On this limited noncontrast attenuation correction CT, grossly the liver, spleen, adrenal glands and pancreas are preserved. Mild renal atrophy with some nonspecific perinephric stranding. No renal or ureteral stones. Underdistended urinary bladder. Extensive left-sided colonic diverticulosis. The bowel is nondilated. Air-fluid level along the stomach. Extensive vascular calcifications. SKELETON: No focal hypermetabolic activity to suggest skeletal metastasis. Incidental CT findings: Scattered degenerative changes. IMPRESSION: Hypermetabolic right upper lobe lung mass again seen consistent with neoplasm until proven otherwise. No additional areas of abnormal uptake along the chest including along lymph nodes. Only slight uptake along the right lung hilum. Multiple areas of abnormal uptake in the neck including involving the parotid glands and adjacent lymph nodes. The intrinsic parotid lesions at hypermetabolic could be intraparotid  lymph nodes versus intrinsic mass lesions. Please correlate for any known history or dedicated further workup is recommended. Gallstones.  Colonic diverticulosis. Electronically Signed   By: Ranell Bring M.D.   On: 03/28/2024 14:25    ASSESSMENT AND PLAN: This is a very pleasant 83 years old white male with stage IIb/IV (T2b, N1, M0/M1 a) non-small cell lung cancer, squamous cell carcinoma presented with large right upper lobe lung mass in addition to right hilar lymphadenopathy seen on the PET scan.  His previous CT scan of the chest showed satellite pulmonary nodules in addition to suspicious mediastinal lymphadenopathy that were not active on the PET scan.  This was diagnosed in July 2025.  He also has several parotid gland hypermetabolic lesions likely a different process but metastatic disease could not be completely excluded.  Assessment and Plan Assessment & Plan Non-small cell lung cancer of right upper lobe with right hilar lymphadenopathy 83 year old male with squamous cell carcinoma presenting with a large right upper lobe lung mass and right hilar lymphadenopathy. Satellite nodules present but not active on PET scan. Not a surgical candidate due to inadequate lung capacity. Treatment options include chemoradiation or radiation alone. Concerns about chemotherapy side effects such as alopecia, nausea, vomiting, and fatigue, though these are less severe with radiosensitizing doses. Radiation options include daily sessions for six and a half weeks or high-dose radiation over a shorter period, which may not require chemotherapy. Decision pending consultation with radiation oncologist. - Consult with radiation oncologist to determine treatment plan. - If radiation alone is chosen, schedule follow-up in four months with a CT scan one week prior to assess response. - If chemoradiation is chosen, schedule bi-weekly visits during treatment for monitoring. The patient was advised to call immediately if  he has any concerning symptoms in the interval. The patient voices understanding of current disease status and treatment options and is in agreement with the current care plan.  All questions were answered. The patient knows to call the clinic with any problems, questions or concerns. We can certainly see the patient much sooner if necessary.  The total time spent in the appointment was 55 minutes including review of chart and various tests results, discussions about  plan of care and coordination of care plan .   Disclaimer: This note was dictated with voice recognition software. Similar sounding words can inadvertently be transcribed and may not be corrected upon review.

## 2024-04-26 NOTE — Telephone Encounter (Signed)
 Scheduled patient appointments and spoke with patients daughter about [email or web address]

## 2024-04-26 NOTE — Progress Notes (Signed)
 Radiation Oncology         [phone] ________________________________  Initial Outpatient Consultation - Conducted via telephone at patient request.  I spoke with the patient to conduct this consult visit via telephone. The patient was notified in advance and was offered an in person or telemedicine meeting to allow for face to face communication but instead preferred to proceed with a telephone consult.   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   HISTORY OF PRESENT ILLNESS: Eric Ochoa is a 83 y.o. male seen at the request of Dr. Sherrod for diagnosis of non-small cell lung cancer.  The patient is a chronic smoker and was counseled on a lung cancer screening CT scan which was performed on 03/13/2024 and identified a spiculated mass in the right upper lobe measuring 5 x 3.9 cm with adjacent interlobular septal thickening and groundglass change.  There was right hilar fullness overlapping vascular structures but not delineated on the noncontrasted exam.  He also had a well-circumscribed 6.3 cm mass in the subcutaneous tissue of the right posterior chest wall likely a sebaceous cyst and bibasilar subpleural reticulations with honeycombing suggesting interstitial lung disease.  He had stigmata of atherosclerotic and emphysematous disease.  A PET scan was recommended after he met with Dr. Anda to review recommendations and this was performed on 03/28/2024.  The scan identified bilateral hypermetabolic parotid lesions versus upper cervical lymph nodes with SUV of 14.3 with 3 specific lesions on the left and a 12 Ochoa lesion on the right with adjacent abnormal lymph nodes.  In the chest the right upper lobe nodule seen on prior imaging measured 5 cm  with satellite nodules and the primary focus had an SUV of 16.4 the small satellite lesions did not show significant uptake.  There was only minimal uptake along the right hilum with an SUV of 3.3 and no discrete uptake above the blood pool and the rest of the mediastinum hilum or axilla.  He underwent bronchoscopy with Dr. Isadora on 03/29/2024.  Cytology from this procedure showed non-small cell carcinoma consistent with a squamous cell carcinoma in the fine-needle aspirate and brushings of the right upper lobe.  Lavage was negative for malignancy, and fine-needle aspirate from the 4L and station 7 lymph nodes were were negative for disease.  An 11R lymph node fine-needle aspirate showed atypical cells with no lymphoid component present.  He was counseled on the rationale to be further evaluated by cardiothoracic surgery and was seen by Dr. Kerrin on 04/04/2024.  Given the patient's comorbidities he was not felt to be a good surgical resection candidate.  He also recommended an MRI of the brain for the patient which was performed on 04/05/2024 and was negative for intracranial disease but identified the parotid lesions noted on prior imaging.  The patient is scheduled to undergo a biopsy of the parotid glands on 05/07/2024.  He saw Dr. Sherrod who discussed options of either chemoradiation or consideration of a more stereotactic approach and is contacted today by phone to discuss these options.    PREVIOUS RADIATION THERAPY: No   PAST MEDICAL HISTORY:  Past Medical History:  Diagnosis Date   Abnormal results of kidney function studies  Abnormal weight loss    Disorder of kidney and ureter    Essential hypertension    Hereditary and idiopathic neuropathy    Hypercalcemia    Iron deficiency anemia    Mixed hyperlipidemia    Mononeuritis multiplex    Tobacco use    Type 2 diabetes mellitus (HCC)        PAST SURGICAL HISTORY: Past Surgical History:  Procedure Laterality Date   COLONOSCOPY N/A  11/01/2016   Procedure: COLONOSCOPY;  Surgeon: Margo LITTIE Haddock, Ochoa;  Location: [facility];  Service: Endoscopy;  Laterality: N/A;  1:45pm   ENDOBRONCHIAL ULTRASOUND Bilateral 03/29/2024   Procedure: ENDOBRONCHIAL ULTRASOUND (EBUS);  Surgeon: Eric Hose, Ochoa;  Location: [facility] ENDOSCOPY;  Service: Pulmonary;  Laterality: Bilateral;   ESOPHAGOGASTRODUODENOSCOPY N/A 11/01/2016   Procedure: ESOPHAGOGASTRODUODENOSCOPY (EGD);  Surgeon: Margo LITTIE Haddock, Ochoa;  Location: [facility];  Service: Endoscopy;  Laterality: N/A;   LAPAROSCOPIC APPENDECTOMY N/A 1975   VIDEO BRONCHOSCOPY WITH ENDOBRONCHIAL NAVIGATION Bilateral 03/29/2024   Procedure: VIDEO BRONCHOSCOPY WITH ENDOBRONCHIAL NAVIGATION;  Surgeon: Eric Hose, Ochoa;  Location: [facility] ENDOSCOPY;  Service: Pulmonary;  Laterality: Bilateral;     FAMILY HISTORY:  Family History  Problem Relation Age of Onset   Colon cancer Neg Hx      SOCIAL HISTORY:  reports that he has been smoking cigarettes. He started smoking about 2 years ago. He has a 23.8 pack-year smoking history. He has never used smokeless tobacco. He reports that he does not drink alcohol and does not use drugs. The patient is widowed and lives in [place] with his daughter. They enjoy going to the mountains of [place].    ALLERGIES: Patient has no known allergies.   MEDICATIONS:  Current Outpatient Medications  Medication Sig Dispense Refill   amLODipine (NORVASC) 10 MG tablet Take 10 mg by mouth daily.      amoxicillin  (AMOXIL ) 500 MG tablet 2 PO BID FOR 10 DAYS 40 tablet 0   aspirin EC 81 MG tablet Take 81 mg by mouth daily.     atorvastatin (LIPITOR) 40 MG tablet Take 40 mg by mouth daily.     chlorhexidine  (HIBICLENS ) 4 % external liquid Apply topically daily as needed. 236 mL 0   Choline Fenofibrate (FENOFIBRIC ACID) 135 MG CPDR Take 135 mg by mouth daily.      ezetimibe (ZETIA) 10 MG tablet Take 1 tablet by mouth daily.     FARXIGA 10 MG TABS tablet TAKE 1 TABLET BY MOUTH DAILY FOR  PROTEIN IN URINE     ferrous sulfate 325 (65 FE) MG tablet Take 325 mg by mouth daily with breakfast.     gabapentin (NEURONTIN) 800 MG tablet Take 800 mg by mouth at bedtime.      Iron-FA-B Cmp-C-Biot-Probiotic (FUSION PLUS) CAPS Take 1 capsule by mouth daily.     losartan (COZAAR) 100 MG tablet Take 100 mg by mouth daily.      metFORMIN (GLUCOPHAGE) 1000 MG tablet Take 1,000 mg by mouth daily with breakfast.      omeprazole  (PRILOSEC) 20 MG capsule TAKE ONE CAPSULE BY MOUTH 30 MINUTES PRIOR TO BREAKFAST AND SUPPER 180 capsule 0   simvastatin (ZOCOR) 40 MG tablet Take 40 mg by mouth daily.      VELTASSA 8.4 g packet 8.4 g.     No current facility-administered medications for this encounter.     REVIEW OF SYSTEMS: On review of systems, the patient reports that he is doing well overall. He is short of  breath with exertion but feels he tires with long distances of walking. His legs seem to slow him down more than his breathing. He has non productive cough. He denies chest pain or pressure. He's lost about 9 pounds in the last year. No other complaints are verbalized.      PHYSICAL EXAM:  Unable to assess due to encounter type.    ECOG = 1  0 - Asymptomatic (Fully active, able to carry on all predisease activities without restriction)  1 - Symptomatic but completely ambulatory (Restricted in physically strenuous activity but ambulatory and able to carry out work of a light or sedentary nature. For example, light housework, office work)  2 - Symptomatic, <50% in bed during the day (Ambulatory and capable of all self care but unable to carry out any work activities. Up and about more than 50% of waking hours)  3 - Symptomatic, >50% in bed, but not bedbound (Capable of only limited self-care, confined to bed or chair 50% or more of waking hours)  4 - Bedbound (Completely disabled. Cannot carry on any self-care. Totally confined to bed or chair)  5 - Death   Eric Ochoa, Creech RH, Tormey  DC, et al. [phone]). Toxicity and response criteria of the [facility]. Am. DOROTHA Bridges. Oncol. 5 (6): 649-55    LABORATORY DATA:  Lab Results  Component Value Date   WBC 6.1 04/26/2024   HGB 11.0 (L) 04/26/2024   HCT 33.8 (L) 04/26/2024   MCV 82.6 04/26/2024   PLT 244 04/26/2024   Lab Results  Component Value Date   NA 143 04/26/2024   K 4.2 04/26/2024   CL 111 04/26/2024   CO2 26 04/26/2024   Lab Results  Component Value Date   ALT 18 04/26/2024   AST 19 04/26/2024   ALKPHOS 80 04/26/2024   BILITOT 0.3 04/26/2024      RADIOGRAPHY: NM Myocar Multi W/Spect W/Wall Motion / EF Result Date: 04/16/2024   The study is normal. The study is low risk.   No ST deviation was noted.   LV perfusion is normal. There is no evidence of ischemia. There is no evidence of infarction.   Left ventricular function is normal. Nuclear stress EF: 70%. End diastolic cavity size is normal. End systolic cavity size is normal.   ECHOCARDIOGRAM COMPLETE Result Date: 04/12/2024    ECHOCARDIOGRAM REPORT   Patient Name:   Eric Ochoa Date of Exam: 04/12/2024 Medical Rec #:  [id number]     Height:       72.0 in Accession #:    [id number]    Weight:       138.0 lb Date of Birth:  08-Dec-1940     BSA:          1.820 m Patient Age:    83 years      BP:           129/62 mmHg Patient Gender: M             HR:           69 bpm. Exam Location:  [address] Procedure: 2D Echo, 3D Echo, Cardiac Doppler, Color Doppler and Strain Analysis            (Both Spectral and Color Flow Doppler were utilized during            procedure). Indications:    Z01.810 Pre-operative exam  History:        Patient has no  prior history of Echocardiogram examinations.                 Lung cancer; Risk Factors:Hypertension, HLD and Diabetes.  Sonographer:    Eric Ochoa Referring Phys: [id number] STEVEN C HENDRICKSON IMPRESSIONS  1. Left ventricular ejection fraction, by estimation, is 60 to 65%. Left ventricular ejection  fraction by 3D volume is 59 %. The left ventricle has normal function. The left ventricle has no regional wall motion abnormalities. Left ventricular diastolic  parameters are consistent with Grade I diastolic dysfunction (impaired relaxation). The average left ventricular global longitudinal strain is -18.4 %. The global longitudinal strain is normal.  2. Right ventricular systolic function is normal. The right ventricular size is normal. There is normal pulmonary artery systolic pressure.  3. The mitral valve is normal in structure. Trivial mitral valve regurgitation. No evidence of mitral stenosis.  4. The aortic valve is tricuspid. There is moderate calcification of the aortic valve. Aortic valve regurgitation is not visualized. Moderate aortic valve stenosis. Aortic valve area, by VTI measures 0.93 cm. Aortic valve mean gradient measures 15.0 mmHg. Aortic valve Vmax measures 2.56 m/s.  5. Aortic dilatation noted.  6. The inferior vena cava is normal in size with greater than 50% respiratory variability, suggesting right atrial pressure of 3 mmHg. FINDINGS  Left Ventricle: Left ventricular ejection fraction, by estimation, is 60 to 65%. Left ventricular ejection fraction by 3D volume is 59 %. The left ventricle has normal function. The left ventricle has no regional wall motion abnormalities. The average left ventricular global longitudinal strain is -18.4 %. Strain was performed and the global longitudinal strain is normal. The left ventricular internal cavity size was normal in size. There is no left ventricular hypertrophy. Left ventricular diastolic parameters are consistent with Grade I diastolic dysfunction (impaired relaxation). Right Ventricle: The right ventricular size is normal. No increase in right ventricular wall thickness. Right ventricular systolic function is normal. There is normal pulmonary artery systolic pressure. The tricuspid regurgitant velocity is 2.45 m/s, and  with an assumed right  atrial pressure of 3 mmHg, the estimated right ventricular systolic pressure is 27.0 mmHg. Left Atrium: Left atrial size was normal in size. Right Atrium: Right atrial size was normal in size. Pericardium: There is no evidence of pericardial effusion. Mitral Valve: The mitral valve is normal in structure. Mild mitral annular calcification. Trivial mitral valve regurgitation. No evidence of mitral valve stenosis. Tricuspid Valve: The tricuspid valve is normal in structure. Tricuspid valve regurgitation is trivial. No evidence of tricuspid stenosis. Aortic Valve: The aortic valve is tricuspid. There is moderate calcification of the aortic valve. Aortic valve regurgitation is not visualized. Moderate aortic stenosis is present. Aortic valve mean gradient measures 15.0 mmHg. Aortic valve peak gradient  measures 26.2 mmHg. Aortic valve area, by VTI measures 0.93 cm. Pulmonic Valve: The pulmonic valve was normal in structure. Pulmonic valve regurgitation is not visualized. No evidence of pulmonic stenosis. Aorta: Aortic dilatation noted. Venous: The inferior vena cava is normal in size with greater than 50% respiratory variability, suggesting right atrial pressure of 3 mmHg. IAS/Shunts: No atrial level shunt detected by color flow Doppler. Additional Comments: 3D was performed not requiring image post processing on an independent workstation and was normal.  LEFT VENTRICLE PLAX 2D LVIDd:         4.30 cm         Diastology LVIDs:         3.10 cm  LV e' medial:    5.00 cm/s LV PW:         1.00 cm         LV E/e' medial:  13.5 LV IVS:        0.90 cm         LV e' lateral:   9.46 cm/s LVOT diam:     1.80 cm         LV E/e' lateral: 7.1 LV SV:         55 LV SV Index:   30              2D Longitudinal LVOT Area:     2.54 cm        Strain                                2D Strain GLS   -18.4 %                                Avg:                                 3D Volume EF                                LV 3D EF:    Left                                              ventricul                                             ar                                             ejection                                             fraction                                             by 3D                                             volume is                                             59 %.  3D Volume EF:                                3D EF:        59 %                                LV EDV:       112 ml                                LV ESV:       46 ml                                LV SV:        66 ml RIGHT VENTRICLE RV Basal diam:  4.20 cm RV S prime:     9.68 cm/s TAPSE (M-mode): 2.8 cm RVSP:           27.0 mmHg LEFT ATRIUM             Index        RIGHT ATRIUM           Index LA diam:        3.60 cm 1.98 cm/m   RA Pressure: 3.00 mmHg LA Vol (A2C):   64.4 ml 35.39 ml/m  RA Area:     11.80 cm LA Vol (A4C):   26.8 ml 14.73 ml/m  RA Volume:   24.10 ml  13.24 ml/m LA Biplane Vol: 41.7 ml 22.92 ml/m  AORTIC VALVE AV Area (Vmax):    0.95 cm AV Area (Vmean):   0.85 cm AV Area (VTI):     0.93 cm AV Vmax:           256.00 cm/s AV Vmean:          181.000 cm/s AV VTI:            0.594 m AV Peak Grad:      26.2 mmHg AV Mean Grad:      15.0 mmHg LVOT Vmax:         95.80 cm/s LVOT Vmean:        60.400 cm/s LVOT VTI:          0.218 m LVOT/AV VTI ratio: 0.37  AORTA Ao Root diam: 3.60 cm Ao Asc diam:  3.90 cm MITRAL VALVE               TRICUSPID VALVE MV Area (PHT):             TR Peak grad:   24.0 mmHg MV Decel Time:             TR Vmax:        245.00 cm/s MV E velocity: 67.60 cm/s  Estimated RAP:  3.00 mmHg MV A velocity: 93.30 cm/s  RVSP:           27.0 mmHg MV E/A ratio:  0.72                            SHUNTS                            Systemic  VTI:  0.22 m                            Systemic Diam: 1.80 cm Eric Ochoa Electronically signed by Eric Ochoa Signature Date/Time: 04/12/2024/6:05:02 PM     Final    MR Brain W Wo Contrast Result Date: 04/05/2024 CLINICAL DATA:  Provided history: Lung mass. Metastatic disease evaluation. EXAM: MRI HEAD WITHOUT AND WITH CONTRAST TECHNIQUE: Multiplanar, multiecho pulse sequences of the brain and surrounding structures were obtained without and with intravenous contrast. CONTRAST:  6.4 mL Vueway  intravenous contrast. COMPARISON:  PET CT 03/28/2024. FINDINGS: Brain: Generalized cerebral atrophy. Chronic lacunar infarcts within the bilateral cerebral hemispheric white matter. Moderate multifocal T2 FLAIR hyperintense signal abnormality elsewhere within the cerebral white matter, nonspecific but compatible chronic small vessel ischemic disease. Mild chronic small ischemic changes also present within the pons. Tiny chronic infarcts within the bilateral cerebellar hemispheres. No cortical encephalomalacia is identified. There is no acute infarct. No evidence of an intracranial mass. No chronic intracranial blood products. No extra-axial fluid collection. No midline shift. No pathologic intracranial enhancement identified. Vascular: Maintained flow voids within the proximal large arterial vessels. Skull and upper cervical spine: No focal worrisome marrow lesion. Incompletely assessed cervical spondylosis. Sinuses/Orbits: No mass or acute finding within the imaged orbits. Minimal mucosal thickening within the left frontal sinus. Mild bilateral ethmoid sinusitis. Other: Multiple enhancing parotid lesions bilaterally, the largest on the left measuring 2.4 x 2.0 cm (for instance as seen on series 15, image 11). Impression #3 will be called to the ordering clinician or representative by the Radiologist Assistant, and communication documented in the PACS or [employer]. IMPRESSION: 1. No evidence of intracranial metastatic disease. 2. 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 3. Multiple enhancing parotid lesions bilaterally,  the largest on the left measuring 2.4 x 2.0 cm. These could reflect primary parotid neoplasms and/or abnormal lymph nodes (such as from nodal metastatic disease). Consider direct tissue sampling. 4. Mild paranasal sinus disease. Electronically Signed   By: Eric Childs D.O.   On: 04/05/2024 15:49   DG Chest Port 1 View Result Date: 03/29/2024 CLINICAL DATA:  Status post bronchoscopy. EXAM: PORTABLE CHEST 1 VIEW COMPARISON:  Chest CT dated 03/13/2024. FINDINGS: No pneumothorax post bronchoscopy. Right upper lobe mass. There is background of emphysema and chronic interstitial coarsening. No pleural effusion pneumothorax. The cardiac silhouette is within normal limits. Atherosclerotic calcification of the aorta. No acute osseous pathology. IMPRESSION: 1. No pneumothorax post bronchoscopy. 2. Right upper lobe mass. Electronically Signed   By: Eric Chou M.D.   On: 03/29/2024 11:28   DG C-ARM BRONCHOSCOPY Result Date: 03/29/2024 C-ARM BRONCHOSCOPY: Fluoroscopy was utilized by the requesting physician.  No radiographic interpretation.   NM PET Image Initial (PI) Skull Base To Thigh Result Date: 03/28/2024 CLINICAL DATA:  Initial treatment strategy for lung nodule. EXAM: NUCLEAR MEDICINE PET SKULL BASE TO THIGH TECHNIQUE: 6.83 mCi F-18 FDG was injected intravenously. Full-ring PET imaging was performed from the skull base to thigh after the radiotracer. CT data was obtained and used for attenuation correction and anatomic localization. Fasting blood glucose: 87 mg/dl COMPARISON:  Noncontrast CT 03/13/2024. FINDINGS: Mediastinal blood pool activity: SUV max 2.2 Liver activity: SUV max 3.2 NECK: There are bilateral hypermetabolic parotid lesions which could be nodes versus intrinsic parotid masses. Example on the left posteroinferior has maximum SUV of 14.3 and dimension on series 4, image  21 2.1 by 1.9 cm. There are 3 such left-sided parotid lesions. On the right side there is an intrinsic parotid lesion as  well on CT image 19 measuring 12 by 10 Ochoa and has maximum SUV of 5.2. There is also some adjacent abnormal nodes. On the right side just posteroinferior to the parotid gland are 2 such nodes present largest has maximum SUV of 10.4 and dimension on image 26 of series 4 measuring 16 by 13 Ochoa. Left-sided focus just inferior to the parotid gland on image 29 measures 13 by 6 Ochoa and has maximum SUV of 6.6. No other areas of abnormal uptake in the neck. Near symmetric uptake of the visualized portions of the intracranial compartment. Incidental CT findings: Mastoid air cells are clear. There is some mild opacity along the anterior ethmoid air cells. Prominent vascular calcifications in the neck. The submandibular glands and thyroid gland are grossly preserved. CHEST: Heterogeneous mass identified right upper lobe as seen on the prior CT scan of the chest. On the prior CT this measured 5.0 x 3.9 cm with some satellite nodules. The main lesion today has maximum SUV 16.4 consistent with neoplasm. The small satellite areas not show clear significant abnormal uptake. No additional areas of abnormal uptake along the lung parenchyma. There is only minimal uptake along the right lung hilum with maximum SUV of 3.3. No discrete abnormal nodal uptake above blood pool otherwise along the axillary region, hilum or mediastinum. Incidental CT findings: Heart nonenlarged. No pericardial effusion. Prominent vascular calcifications along the thoracic aorta, great vessels and coronary arteries. Normal caliber thoracic esophagus. Breathing motion. Emphysematous lung changes identified. No pneumothorax or effusion. Once again there is a chest wall lesion on the right side which is somewhat cystic and does not show abnormal uptake. Favor benign lesion. ABDOMEN/PELVIS: No abnormal hypermetabolic activity within the liver, pancreas, adrenal glands, or spleen. No hypermetabolic lymph nodes in the abdomen or pelvis. Incidental CT findings:  Distended gallbladder with some layering stones. On this limited noncontrast attenuation correction CT, grossly the liver, spleen, adrenal glands and pancreas are preserved. Mild renal atrophy with some nonspecific perinephric stranding. No renal or ureteral stones. Underdistended urinary bladder. Extensive left-sided colonic diverticulosis. The bowel is nondilated. Air-fluid level along the stomach. Extensive vascular calcifications. SKELETON: No focal hypermetabolic activity to suggest skeletal metastasis. Incidental CT findings: Scattered degenerative changes. IMPRESSION: Hypermetabolic right upper lobe lung mass again seen consistent with neoplasm until proven otherwise. No additional areas of abnormal uptake along the chest including along lymph nodes. Only slight uptake along the right lung hilum. Multiple areas of abnormal uptake in the neck including involving the parotid glands and adjacent lymph nodes. The intrinsic parotid lesions at hypermetabolic could be intraparotid lymph nodes versus intrinsic mass lesions. Please correlate for any known history or dedicated further workup is recommended. Gallstones.  Colonic diverticulosis. Electronically Signed   By: Eric Bring M.D.   On: 03/28/2024 14:25       IMPRESSION/PLAN: 1. At least stage IIA, cT2bN0Mx, NSCLC, squamous cell carcinoma of the RUL.  Eric Ochoa discusses the patient's workup today including his cytology results from bronchoscopy, as well as his imaging results to date.  Eric Ochoa agrees with further assessment with biopsies of his parotid findings which is scheduled in a little over a week.  Eric Ochoa discusses that in more detail review he has less concerned about nodal involvement of the cancer.  While we did discuss a course of chemoradiation initially, Eric Ochoa feels that  he would prefer to offer   a more localized approach with stereotactic body radiotherapy (SBRT).  We discussed the risks, benefits, short and long-term effects of  radiotherapy as well as the delivery and logistics.  The patient is interested in proceeding with a definitive dose and to follow the lymph nodes in the mediastinum on future surveillance.  He is aware that the findings from his biopsies to the parotid region could also impact these recommendations and we will follow-up with these results when available.  The patient is happy to hear this alternative to chemoradiation and we will coordinate for him to come for simulation at which time he will sign written consent to proceed.  I will communicate this information as well with Dr. Sherrod.    This encounter was conducted via telephone.  The patient has provided two factor identification and has given verbal consent for this type of encounter and has been advised to only accept a meeting of this type in a secure network environment. The time spent during this encounter was 60 minutes including preparation, discussion, and coordination of the patient's care. The attendants for this meeting include Eric Cary, RN, Eric Ochoa, Donald Estefana Husband  and Rudolfo KATHEE Reasoner and his daughter Blair Lundeen.  During the encounter,  Eric Cary, RN, Eric Ochoa, and Donald Estefana Husband were located at [facility] Radiation Oncology Department.  ANTONIOS OSTROW was located at home with his daughter Chloe Baig.    The above documentation reflects my direct findings during this shared patient visit. Please see the separate note by Eric Ochoa on this date for the remainder of the patient's plan of care.    [name], [facility]   **Disclaimer: This note was dictated with voice recognition software. Similar sounding words can inadvertently be transcribed and this note may contain transcription errors which may not have been corrected upon publication of note.**

## 2024-05-01 DIAGNOSIS — F17210 Nicotine dependence, cigarettes, uncomplicated: Secondary | ICD-10-CM | POA: Diagnosis not present

## 2024-05-01 DIAGNOSIS — C3411 Malignant neoplasm of upper lobe, right bronchus or lung: Secondary | ICD-10-CM | POA: Diagnosis not present

## 2024-05-02 NOTE — Addendum Note (Signed)
 Addended by: KOLEEN RENSHAW R on: 05/02/2024 04:12 PM   Modules accepted: Orders

## 2024-05-07 ENCOUNTER — Other Ambulatory Visit: Payer: Self-pay | Admitting: Internal Medicine

## 2024-05-07 ENCOUNTER — Ambulatory Visit (HOSPITAL_COMMUNITY)
Admission: RE | Admit: 2024-05-07 | Discharge: 2024-05-07 | Disposition: A | Discharge status: Home / Self Care | Source: Ambulatory Visit | Attending: Internal Medicine | Admitting: Internal Medicine

## 2024-05-07 DIAGNOSIS — D110 Benign neoplasm of parotid gland: Secondary | ICD-10-CM | POA: Diagnosis not present

## 2024-05-07 DIAGNOSIS — C3490 Malignant neoplasm of unspecified part of unspecified bronchus or lung: Secondary | ICD-10-CM | POA: Diagnosis not present

## 2024-05-07 DIAGNOSIS — K118 Other diseases of salivary glands: Secondary | ICD-10-CM | POA: Diagnosis not present

## 2024-05-07 DIAGNOSIS — C3411 Malignant neoplasm of upper lobe, right bronchus or lung: Secondary | ICD-10-CM

## 2024-05-07 MED ORDER — LIDOCAINE HCL (PF) 1 % IJ SOLN
5.0000 mL | Freq: Once | INTRAMUSCULAR | Status: AC
  Administered 2024-05-07: 5 mL via INTRADERMAL

## 2024-05-07 NOTE — Procedures (Signed)
 Interventional Radiology Procedure Note  Procedure: US  ASP AND CORE BX LEFT PAROTID MASS    Complications: None  Estimated Blood Loss:  MIN  Findings: 18 ASP FOR CYTO 18G FRAGMENTED CORES FOR PATH     EMERSON FREDERIC SPECKING, MD

## 2024-05-08 ENCOUNTER — Ambulatory Visit
Admission: RE | Admit: 2024-05-08 | Discharge: 2024-05-08 | Disposition: A | Discharge status: Home / Self Care | Source: Ambulatory Visit | Attending: Radiation Oncology | Admitting: Radiation Oncology

## 2024-05-08 DIAGNOSIS — F17210 Nicotine dependence, cigarettes, uncomplicated: Secondary | ICD-10-CM | POA: Diagnosis not present

## 2024-05-08 DIAGNOSIS — Z510 Encounter for antineoplastic radiation therapy: Secondary | ICD-10-CM | POA: Insufficient documentation

## 2024-05-08 DIAGNOSIS — C3411 Malignant neoplasm of upper lobe, right bronchus or lung: Secondary | ICD-10-CM | POA: Insufficient documentation

## 2024-05-08 LAB — CYTOLOGY - NON PAP

## 2024-05-08 LAB — SURGICAL PATHOLOGY

## 2024-05-18 DIAGNOSIS — C3411 Malignant neoplasm of upper lobe, right bronchus or lung: Secondary | ICD-10-CM | POA: Diagnosis not present

## 2024-05-18 DIAGNOSIS — F17210 Nicotine dependence, cigarettes, uncomplicated: Secondary | ICD-10-CM | POA: Diagnosis not present

## 2024-05-18 DIAGNOSIS — Z510 Encounter for antineoplastic radiation therapy: Secondary | ICD-10-CM | POA: Diagnosis not present

## 2024-05-22 ENCOUNTER — Other Ambulatory Visit: Payer: Self-pay

## 2024-05-22 ENCOUNTER — Ambulatory Visit
Admission: RE | Admit: 2024-05-22 | Discharge: 2024-05-22 | Disposition: A | Discharge status: Home / Self Care | Source: Ambulatory Visit | Attending: Radiation Oncology | Admitting: Radiation Oncology

## 2024-05-22 DIAGNOSIS — Z510 Encounter for antineoplastic radiation therapy: Secondary | ICD-10-CM | POA: Diagnosis not present

## 2024-05-22 DIAGNOSIS — C3411 Malignant neoplasm of upper lobe, right bronchus or lung: Secondary | ICD-10-CM | POA: Diagnosis not present

## 2024-05-22 LAB — RAD ONC ARIA SESSION SUMMARY
Course Elapsed Days: 0
Plan Fractions Treated to Date: 1
Plan Prescribed Dose Per Fraction: 12 Gy
Plan Total Fractions Prescribed: 5
Plan Total Prescribed Dose: 60 Gy
Reference Point Dosage Given to Date: 12 Gy
Reference Point Session Dosage Given: 12 Gy
Session Number: 1

## 2024-05-23 ENCOUNTER — Ambulatory Visit: Admitting: Radiation Oncology

## 2024-05-24 ENCOUNTER — Other Ambulatory Visit: Payer: Self-pay

## 2024-05-24 ENCOUNTER — Ambulatory Visit
Admission: RE | Admit: 2024-05-24 | Discharge: 2024-05-24 | Disposition: A | Discharge status: Home / Self Care | Source: Ambulatory Visit | Attending: Radiation Oncology | Admitting: Radiation Oncology

## 2024-05-24 DIAGNOSIS — Z510 Encounter for antineoplastic radiation therapy: Secondary | ICD-10-CM | POA: Diagnosis not present

## 2024-05-24 DIAGNOSIS — C3411 Malignant neoplasm of upper lobe, right bronchus or lung: Secondary | ICD-10-CM | POA: Diagnosis not present

## 2024-05-24 LAB — RAD ONC ARIA SESSION SUMMARY
Course Elapsed Days: 2
Plan Fractions Treated to Date: 2
Plan Prescribed Dose Per Fraction: 12 Gy
Plan Total Fractions Prescribed: 5
Plan Total Prescribed Dose: 60 Gy
Reference Point Dosage Given to Date: 24 Gy
Reference Point Session Dosage Given: 12 Gy
Session Number: 2

## 2024-05-25 ENCOUNTER — Ambulatory Visit: Admitting: Radiation Oncology

## 2024-05-28 ENCOUNTER — Ambulatory Visit
Admission: RE | Admit: 2024-05-28 | Discharge: 2024-05-28 | Disposition: A | Source: Ambulatory Visit | Attending: Radiation Oncology | Admitting: Radiation Oncology

## 2024-05-28 ENCOUNTER — Other Ambulatory Visit: Payer: Self-pay

## 2024-05-28 DIAGNOSIS — Z510 Encounter for antineoplastic radiation therapy: Secondary | ICD-10-CM | POA: Diagnosis not present

## 2024-05-28 DIAGNOSIS — C3411 Malignant neoplasm of upper lobe, right bronchus or lung: Secondary | ICD-10-CM | POA: Diagnosis not present

## 2024-05-28 LAB — RAD ONC ARIA SESSION SUMMARY
Course Elapsed Days: 6
Plan Fractions Treated to Date: 3
Plan Prescribed Dose Per Fraction: 12 Gy
Plan Total Fractions Prescribed: 5
Plan Total Prescribed Dose: 60 Gy
Reference Point Dosage Given to Date: 36 Gy
Reference Point Session Dosage Given: 12 Gy
Session Number: 3

## 2024-05-30 ENCOUNTER — Other Ambulatory Visit: Payer: Self-pay

## 2024-05-30 ENCOUNTER — Ambulatory Visit

## 2024-05-30 ENCOUNTER — Ambulatory Visit
Admission: RE | Admit: 2024-05-30 | Discharge: 2024-05-30 | Disposition: A | Discharge status: Home / Self Care | Source: Ambulatory Visit | Attending: Radiation Oncology | Admitting: Radiation Oncology

## 2024-05-30 DIAGNOSIS — Z510 Encounter for antineoplastic radiation therapy: Secondary | ICD-10-CM | POA: Diagnosis present

## 2024-05-30 DIAGNOSIS — C3411 Malignant neoplasm of upper lobe, right bronchus or lung: Secondary | ICD-10-CM | POA: Insufficient documentation

## 2024-05-30 LAB — RAD ONC ARIA SESSION SUMMARY
Course Elapsed Days: 8
Plan Fractions Treated to Date: 4
Plan Prescribed Dose Per Fraction: 12 Gy
Plan Total Fractions Prescribed: 5
Plan Total Prescribed Dose: 60 Gy
Reference Point Dosage Given to Date: 48 Gy
Reference Point Session Dosage Given: 12 Gy
Session Number: 4

## 2024-05-30 NOTE — Progress Notes (Signed)
  Radiation Oncology         [phone] ________________________________  Name: Eric Ochoa MRN: [id number]  Date: 05/30/2024  DOB: 12/19/1940  End of Treatment Note  Diagnosis:     At least stage IIA, cT2bN0Mx, NSCLC, squamous cell carcinoma of the RUL.   Indication for treatment:  Curative       Radiation treatment dates:   05/22/24-present  Site/dose:   The tumor in the RU is being treated with a course of stereotactic body radiation treatment. The patient will receive a total of 60 Gy in 5 fractions at 12 Gy per fraction, he has completed 4 of the planned treatments and will finish up on Friday.  Narrative: The patient has been tolerating radiation treatment relatively well.   The patient did not have any signs of acute toxicity during treatment. The patient will receive a call in about one month from the radiation oncology department. He will continue follow up with Dr. Sherrod in surveillance as well. His daughter had questions about dietary intake given that he is a renal patient. I encouraged them to talk with his nephrologist team as he's trying to increase oral intake.      [name], PAC

## 2024-05-31 ENCOUNTER — Encounter: Payer: Self-pay | Admitting: Podiatry

## 2024-05-31 ENCOUNTER — Ambulatory Visit: Admitting: Podiatry

## 2024-05-31 DIAGNOSIS — M79675 Pain in left toe(s): Secondary | ICD-10-CM

## 2024-05-31 DIAGNOSIS — M79674 Pain in right toe(s): Secondary | ICD-10-CM

## 2024-05-31 DIAGNOSIS — B351 Tinea unguium: Secondary | ICD-10-CM | POA: Diagnosis not present

## 2024-05-31 DIAGNOSIS — E1142 Type 2 diabetes mellitus with diabetic polyneuropathy: Secondary | ICD-10-CM

## 2024-05-31 NOTE — Progress Notes (Signed)
 This patient presents to the office for treatment of his long thick nails.  Patient is diabetic.  He also has thick great toenails both feet.  He presents for nail care.    General Appearance  Alert, conversant and in no acute stress.  Vascular  Dorsalis pedis and posterior tibial  pulses are  weakly palpable  bilaterally.  Capillary return is within normal limits  bilaterally. Cold feet.  Venous stasis  B/L. bilaterally.  Neurologic  Senn-Weinstein monofilament wire test absent   bilaterally. Muscle power within normal limits bilaterally.  Nails Thick disfigured discolored nails with subungual debris  hallux nails bilaterally. No evidence of bacterial infection or drainage bilaterally.  Orthopedic  No limitations of motion  feet .  No crepitus or effusions noted.  No bony pathology or digital deformities noted.  HAV  B/L.  Skin  normotropic skin with no porokeratosis noted bilaterally.  No signs of infections or ulcers noted.  Healing tip fourth toe with no  infection, redness  or swelling.    Onychomycosis Hallux  B/L.  ROV  Debride nails with nail nipper and dremel tool. RTC 3 months    Cordella Bold DPM

## 2024-06-01 ENCOUNTER — Ambulatory Visit
Admission: RE | Admit: 2024-06-01 | Discharge: 2024-06-01 | Disposition: A | Discharge status: Home / Self Care | Source: Ambulatory Visit | Attending: Radiation Oncology | Admitting: Radiation Oncology

## 2024-06-01 ENCOUNTER — Other Ambulatory Visit: Payer: Self-pay

## 2024-06-01 DIAGNOSIS — C3411 Malignant neoplasm of upper lobe, right bronchus or lung: Secondary | ICD-10-CM | POA: Diagnosis not present

## 2024-06-01 DIAGNOSIS — Z510 Encounter for antineoplastic radiation therapy: Secondary | ICD-10-CM | POA: Diagnosis not present

## 2024-06-01 DIAGNOSIS — F17210 Nicotine dependence, cigarettes, uncomplicated: Secondary | ICD-10-CM | POA: Diagnosis not present

## 2024-06-01 LAB — RAD ONC ARIA SESSION SUMMARY
Course Elapsed Days: 10
Plan Fractions Treated to Date: 5
Plan Prescribed Dose Per Fraction: 12 Gy
Plan Total Fractions Prescribed: 5
Plan Total Prescribed Dose: 60 Gy
Reference Point Dosage Given to Date: 60 Gy
Reference Point Session Dosage Given: 12 Gy
Session Number: 5

## 2024-06-04 NOTE — Radiation Completion Notes (Signed)
  Radiation Oncology         [phone] ________________________________  Name: [name] MRN: [id number]  Date of Service: 06/01/2024  DOB: 1940/09/16  End of Treatment Note  Diagnosis: At least stage IIA, cT2bN0Mx, NSCLC, squamous cell carcinoma of the RUL.   Intent: Curative   ==========DELIVERED PLANS==========  First Treatment Date: 2024-05-22 Last Treatment Date: 2024-06-01   Plan Name: Lung_R_SBRT Site: Lung, Right Technique: SBRT/SRT-IMRT Mode: Photon Dose Per Fraction: 12 Gy Prescribed Dose (Delivered / Prescribed): 60 Gy / 60 Gy Prescribed Fxs (Delivered / Prescribed): 5 / 5     ==========ON TREATMENT VISIT DATES========== 2024-05-22, 2024-05-24, 2024-05-28, 2024-05-30, 2024-05-30, 2024-06-01   See weekly On Treatment Notes in Epic for details in the Media tab (listed as Progress notes on the On Treatment Visit Dates listed above).The patient has been tolerating radiation treatment relatively well.   The patient did not have any signs of acute toxicity during treatment. The patient will receive a call in about one month from the radiation oncology department. He will continue follow up with Dr. Sherrod in surveillance as well.      [name], PAC

## 2024-06-07 DIAGNOSIS — D509 Iron deficiency anemia, unspecified: Secondary | ICD-10-CM | POA: Diagnosis not present

## 2024-06-07 DIAGNOSIS — E1122 Type 2 diabetes mellitus with diabetic chronic kidney disease: Secondary | ICD-10-CM | POA: Diagnosis not present

## 2024-06-07 DIAGNOSIS — E782 Mixed hyperlipidemia: Secondary | ICD-10-CM | POA: Diagnosis not present

## 2024-06-14 ENCOUNTER — Encounter: Payer: Self-pay | Admitting: Cardiovascular Disease

## 2024-06-14 DIAGNOSIS — D509 Iron deficiency anemia, unspecified: Secondary | ICD-10-CM | POA: Diagnosis not present

## 2024-06-14 DIAGNOSIS — E1122 Type 2 diabetes mellitus with diabetic chronic kidney disease: Secondary | ICD-10-CM | POA: Diagnosis not present

## 2024-06-14 DIAGNOSIS — C3490 Malignant neoplasm of unspecified part of unspecified bronchus or lung: Secondary | ICD-10-CM | POA: Diagnosis not present

## 2024-06-14 DIAGNOSIS — R634 Abnormal weight loss: Secondary | ICD-10-CM | POA: Diagnosis not present

## 2024-06-14 DIAGNOSIS — E782 Mixed hyperlipidemia: Secondary | ICD-10-CM | POA: Diagnosis not present

## 2024-06-14 DIAGNOSIS — R809 Proteinuria, unspecified: Secondary | ICD-10-CM | POA: Diagnosis not present

## 2024-06-14 DIAGNOSIS — E1140 Type 2 diabetes mellitus with diabetic neuropathy, unspecified: Secondary | ICD-10-CM | POA: Diagnosis not present

## 2024-06-14 DIAGNOSIS — E875 Hyperkalemia: Secondary | ICD-10-CM | POA: Diagnosis not present

## 2024-06-14 DIAGNOSIS — C3411 Malignant neoplasm of upper lobe, right bronchus or lung: Secondary | ICD-10-CM | POA: Diagnosis not present

## 2024-06-14 DIAGNOSIS — N1831 Chronic kidney disease, stage 3a: Secondary | ICD-10-CM | POA: Diagnosis not present

## 2024-06-14 DIAGNOSIS — Z0000 Encounter for general adult medical examination without abnormal findings: Secondary | ICD-10-CM | POA: Diagnosis not present

## 2024-06-14 DIAGNOSIS — I129 Hypertensive chronic kidney disease with stage 1 through stage 4 chronic kidney disease, or unspecified chronic kidney disease: Secondary | ICD-10-CM | POA: Diagnosis not present

## 2024-07-19 ENCOUNTER — Ambulatory Visit: Attending: Cardiovascular Disease | Admitting: Cardiovascular Disease

## 2024-07-19 ENCOUNTER — Encounter: Payer: Self-pay | Admitting: Cardiovascular Disease

## 2024-07-19 VITALS — BP 120/52 | HR 65 | Ht 72.0 in | Wt 143.2 lb

## 2024-07-19 DIAGNOSIS — I10 Essential (primary) hypertension: Secondary | ICD-10-CM

## 2024-07-19 DIAGNOSIS — I2510 Atherosclerotic heart disease of native coronary artery without angina pectoris: Secondary | ICD-10-CM

## 2024-07-19 DIAGNOSIS — I359 Nonrheumatic aortic valve disorder, unspecified: Secondary | ICD-10-CM

## 2024-07-19 DIAGNOSIS — Z720 Tobacco use: Secondary | ICD-10-CM

## 2024-07-19 DIAGNOSIS — E785 Hyperlipidemia, unspecified: Secondary | ICD-10-CM

## 2024-07-19 NOTE — Patient Instructions (Signed)

## 2024-07-19 NOTE — Progress Notes (Signed)
 Cardiology Office Note   Date:  07/19/2024  ID:  [place], Wiseman 1941/05/29, MRN [id number]  PCP:  Shona Norleen PEDLAR, MD  Cardiologist:   Deatrice Cage, MD   Chief Complaint  Patient presents with   Follow-up    3 month f/u myoview . Meds reviewed verbally with pt.      History of Present Illness: Eric Ochoa is a 83 y.o. male who is here today for follow-up visit regarding coronary artery disease and aortic stenosis.    He has known history of tobacco use, type 2 diabetes, stage IIIa chronic kidney disease, essential hypertension, tobacco use, hyperlipidemia and GERD.  He was diagnosed with squamous cell carcinoma of the lung this year.  The plan was to proceed with surgical resection. He was seen in August for preop cardiovascular evaluation. He had an echocardiogram done  which showed normal LV systolic function with moderately calcified aortic valve.  There was moderate aortic stenosis with mean gradient of 15 mmHg and valve area of 0.95 cm. CT chest showed extensive coronary artery calcifications.  He underwent a Lexiscan  Myoview  which showed no evidence of ischemia with normal ejection fraction.  There was no absolute contraindication for long cancer resection.  However, given his age and comorbid conditions, it was felt that the risks outweighed the benefits.  Thus, he underwent radiation therapy instead.  He did well with that.  He continues to feel fine with no chest pain or shortness of breath.  Past Medical History:  Diagnosis Date   Abnormal results of kidney function studies    Abnormal weight loss    Disorder of kidney and ureter    Essential hypertension    Hereditary and idiopathic neuropathy    Hypercalcemia    Iron deficiency anemia    Mixed hyperlipidemia    Mononeuritis multiplex    Tobacco use    Type 2 diabetes mellitus (HCC)     Past Surgical History:  Procedure Laterality Date   COLONOSCOPY N/A 11/01/2016   Procedure: COLONOSCOPY;  Surgeon:  Margo LITTIE Haddock, MD;  Location: [facility];  Service: Endoscopy;  Laterality: N/A;  1:45pm   ENDOBRONCHIAL ULTRASOUND Bilateral 03/29/2024   Procedure: ENDOBRONCHIAL ULTRASOUND (EBUS);  Surgeon: Isadora Hose, MD;  Location: [facility] ENDOSCOPY;  Service: Pulmonary;  Laterality: Bilateral;   ESOPHAGOGASTRODUODENOSCOPY N/A 11/01/2016   Procedure: ESOPHAGOGASTRODUODENOSCOPY (EGD);  Surgeon: Margo LITTIE Haddock, MD;  Location: [facility];  Service: Endoscopy;  Laterality: N/A;   LAPAROSCOPIC APPENDECTOMY N/A 1975   VIDEO BRONCHOSCOPY WITH ENDOBRONCHIAL NAVIGATION Bilateral 03/29/2024   Procedure: VIDEO BRONCHOSCOPY WITH ENDOBRONCHIAL NAVIGATION;  Surgeon: Isadora Hose, MD;  Location: [facility] ENDOSCOPY;  Service: Pulmonary;  Laterality: Bilateral;     Current Outpatient Medications  Medication Sig Dispense Refill   amLODipine (NORVASC) 10 MG tablet Take 10 mg by mouth daily.      amoxicillin  (AMOXIL ) 500 MG tablet 2 PO BID FOR 10 DAYS 40 tablet 0   aspirin EC 81 MG tablet Take 81 mg by mouth daily.     atorvastatin (LIPITOR) 40 MG tablet Take 40 mg by mouth daily.     Choline Fenofibrate (FENOFIBRIC ACID) 135 MG CPDR Take 135 mg by mouth daily.      ezetimibe (ZETIA) 10 MG tablet Take 1 tablet by mouth daily.     FARXIGA 10 MG TABS tablet TAKE 1 TABLET BY MOUTH DAILY FOR PROTEIN IN URINE     ferrous sulfate 325 (65 FE) MG tablet Take 325 mg  by mouth daily with breakfast.     gabapentin (NEURONTIN) 800 MG tablet Take 800 mg by mouth at bedtime.      Iron-FA-B Cmp-C-Biot-Probiotic (FUSION PLUS) CAPS Take 1 capsule by mouth daily.     losartan (COZAAR) 100 MG tablet Take 100 mg by mouth daily.      metFORMIN (GLUCOPHAGE) 1000 MG tablet Take 1,000 mg by mouth daily with breakfast.      omeprazole  (PRILOSEC) 20 MG capsule TAKE ONE CAPSULE BY MOUTH 30 MINUTES PRIOR TO BREAKFAST AND SUPPER 180 capsule 0   simvastatin (ZOCOR) 40 MG tablet Take 40 mg by mouth daily.      VELTASSA 8.4 g packet 8.4 g. (Patient taking  differently: 8.4 g 2 (two) times a week.)     No current facility-administered medications for this visit.    Allergies:   Patient has no known allergies.    Social History:  The patient  reports that he has been smoking cigarettes. He started smoking about 2 years ago. He has a 23.9 pack-year smoking history. He has never used smokeless tobacco. He reports that he does not drink alcohol and does not use drugs.   Family History:  The patient's family history is not on file.    ROS:  Please see the history of present illness.   Otherwise, review of systems are positive for none.   All other systems are reviewed and negative.    PHYSICAL EXAM: VS:  BP (!) 120/52 (BP Location: Left Arm, Patient Position: Sitting, Cuff Size: Normal)   Pulse 65   Ht 6' (1.829 m)   Wt 143 lb 4 oz (65 kg)   SpO2 98%   BMI 19.43 kg/m  , BMI Body mass index is 19.43 kg/m. GEN: Well nourished, well developed, in no acute distress  HEENT: normal  Neck: no JVD, carotid bruits, or masses Cardiac: RRR; no  rubs, or gallops,no edema .  3 /6 systolic murmur in the aortic area which is mid peaking.  S2 is well-preserved. Respiratory:  clear to auscultation bilaterally, normal work of breathing GI: soft, nontender, nondistended, + BS MS: no deformity or atrophy  Skin: warm and dry, no rash Neuro:  Strength and sensation are intact Psych: euthymic mood, full affect   EKG:  EKG is not ordered today.    Recent Labs: 04/26/2024: ALT 18; BUN 15; Creatinine 1.47; Hemoglobin 11.0; Platelet Count 244; Potassium 4.2; Sodium 143    Lipid Panel    Component Value Date/Time   CHOL 179 04/09/2009 2226   TRIG 313 (H) 04/09/2009 2226   HDL 29 (L) 04/09/2009 2226   CHOLHDL 6.2 Ratio 04/09/2009 2226   VLDL 63 (H) 04/09/2009 2226   LDLCALC 87 04/09/2009 2226      Wt Readings from Last 3 Encounters:  07/19/24 143 lb 4 oz (65 kg)  05/30/24 134 lb (60.8 kg)  04/26/24 141 lb (64 kg)          04/12/2024     4:24 PM  PAD Screen  Previous PAD dx? No  Previous surgical procedure? No  Pain with walking? No  Feet/toe relief with dangling? No  Painful, non-healing ulcers? No  Extremities discolored? No      ASSESSMENT AND PLAN:  1.  Coronary artery disease involving native coronary arteries without angina: He has extensive coronary artery calcifications on CT chest but Lexiscan  Myoview  showed no evidence of ischemia.  Recommend aggressive medical therapy.  Continue low-dose aspirin.  2.  Aortic stenosis:  Moderate by echo.  Recommend repeat echocardiogram in August 2026.  3.  Tobacco use:  he is cutting down on tobacco use.  4.  Essential hypertension: Blood pressure is controlled.  5.  Hyperlipidemia: He is currently on a statin and ezetimibe.  I reviewed most recent lipid profile done with his primary care physician in October which showed an LDL of 42.     Disposition:   FU in 6 months.  Signed,  Deatrice Cage, MD  07/19/2024 9:15 AM    [facility]

## 2024-08-20 ENCOUNTER — Ambulatory Visit (HOSPITAL_COMMUNITY)
Admission: RE | Admit: 2024-08-20 | Discharge: 2024-08-20 | Disposition: A | Discharge status: Home / Self Care | Source: Ambulatory Visit | Attending: Internal Medicine | Admitting: Internal Medicine

## 2024-08-20 ENCOUNTER — Inpatient Hospital Stay

## 2024-08-20 DIAGNOSIS — Z7982 Long term (current) use of aspirin: Secondary | ICD-10-CM | POA: Diagnosis not present

## 2024-08-20 DIAGNOSIS — C3411 Malignant neoplasm of upper lobe, right bronchus or lung: Secondary | ICD-10-CM | POA: Diagnosis present

## 2024-08-20 DIAGNOSIS — Z79899 Other long term (current) drug therapy: Secondary | ICD-10-CM | POA: Diagnosis not present

## 2024-08-20 DIAGNOSIS — Z923 Personal history of irradiation: Secondary | ICD-10-CM | POA: Insufficient documentation

## 2024-08-20 DIAGNOSIS — C3490 Malignant neoplasm of unspecified part of unspecified bronchus or lung: Secondary | ICD-10-CM | POA: Insufficient documentation

## 2024-08-20 LAB — CMP (CANCER CENTER ONLY)
ALT: 16 U/L (ref 0–44)
AST: 21 U/L (ref 15–41)
Albumin: 4.1 g/dL (ref 3.5–5.0)
Alkaline Phosphatase: 82 U/L (ref 38–126)
Anion gap: 11 (ref 5–15)
BUN: 23 mg/dL (ref 8–23)
CO2: 23 mmol/L (ref 22–32)
Calcium: 9.3 mg/dL (ref 8.9–10.3)
Chloride: 105 mmol/L (ref 98–111)
Creatinine: 1.68 mg/dL — ABNORMAL HIGH (ref 0.61–1.24)
GFR, Estimated: 40 mL/min — ABNORMAL LOW
Glucose, Bld: 106 mg/dL — ABNORMAL HIGH (ref 70–99)
Potassium: 4.9 mmol/L (ref 3.5–5.1)
Sodium: 139 mmol/L (ref 135–145)
Total Bilirubin: 0.3 mg/dL (ref 0.0–1.2)
Total Protein: 7.3 g/dL (ref 6.5–8.1)

## 2024-08-20 LAB — CBC WITH DIFFERENTIAL (CANCER CENTER ONLY)
Abs Immature Granulocytes: 0.01 K/uL (ref 0.00–0.07)
Basophils Absolute: 0.1 K/uL (ref 0.0–0.1)
Basophils Relative: 1 %
Eosinophils Absolute: 0.3 K/uL (ref 0.0–0.5)
Eosinophils Relative: 5 %
HCT: 32.3 % — ABNORMAL LOW (ref 39.0–52.0)
Hemoglobin: 10.5 g/dL — ABNORMAL LOW (ref 13.0–17.0)
Immature Granulocytes: 0 %
Lymphocytes Relative: 26 %
Lymphs Abs: 1.5 K/uL (ref 0.7–4.0)
MCH: 27.1 pg (ref 26.0–34.0)
MCHC: 32.5 g/dL (ref 30.0–36.0)
MCV: 83.2 fL (ref 80.0–100.0)
Monocytes Absolute: 0.5 K/uL (ref 0.1–1.0)
Monocytes Relative: 9 %
Neutro Abs: 3.4 K/uL (ref 1.7–7.7)
Neutrophils Relative %: 59 %
Platelet Count: 231 K/uL (ref 150–400)
RBC: 3.88 MIL/uL — ABNORMAL LOW (ref 4.22–5.81)
RDW: 16.8 % — ABNORMAL HIGH (ref 11.5–15.5)
WBC Count: 5.8 K/uL (ref 4.0–10.5)
nRBC: 0 % (ref 0.0–0.2)

## 2024-08-27 ENCOUNTER — Inpatient Hospital Stay: Admitting: Internal Medicine

## 2024-08-27 VITALS — BP 120/62 | HR 74 | Temp 97.6°F | Resp 17 | Ht 72.0 in | Wt 143.0 lb

## 2024-08-27 DIAGNOSIS — C3490 Malignant neoplasm of unspecified part of unspecified bronchus or lung: Secondary | ICD-10-CM

## 2024-08-27 DIAGNOSIS — K769 Liver disease, unspecified: Secondary | ICD-10-CM | POA: Diagnosis not present

## 2024-08-27 DIAGNOSIS — C3411 Malignant neoplasm of upper lobe, right bronchus or lung: Secondary | ICD-10-CM | POA: Diagnosis not present

## 2024-08-27 NOTE — Progress Notes (Signed)
 "     [facility] Telephone:([phone]) [phone]   Fax:([phone]  OFFICE PROGRESS NOTE  Eric Norleen PEDLAR, MD [address] Eric Ochoa [place] [address]  DIAGNOSIS: stage IIA (T2b, N0, M0) non-small cell lung cancer, squamous cell carcinoma presented with large right upper lobe lung mass in addition to right hilar lymphadenopathy seen on the PET scan.  His previous CT scan of the chest showed satellite pulmonary nodules in addition to suspicious mediastinal lymphadenopathy that were not active on the PET scan.  This was diagnosed in July 2025.  He also has several parotid gland hypermetabolic lesions likely a different process but metastatic disease could not be completely excluded.   PRIOR THERAPY: Status post SBRT to the right upper lobe lung nodule under the care of Dr. Dewey completed on June 01, 2024.  CURRENT THERAPY: Observation  INTERVAL HISTORY: Eric Ochoa 83 y.o. male returns to the clinic today for follow-up visit accompanied by his daughter. Discussed the use of AI scribe software for clinical note transcription with the patient, who gave verbal consent to proceed.  History of Present Illness Eric Ochoa is an 83 year old male with stage IIa non-small cell lung cancer, squamous cell carcinoma, status post SBRT, who presents for surveillance imaging and evaluation of a new indeterminate liver lesion.  Diagnosed with stage IIa non-small cell lung cancer, squamous cell carcinoma, in July 2025, he completed stereotactic body radiation therapy on June 01, 2024, and has since been under surveillance. He remains asymptomatic, denying chest pain, dyspnea, nausea, vomiting, or recent weight loss. He notes mild weight gain and maintains a high level of activity without limitations.  However, a new subcapsular hypoattenuating lesion measuring 1.5 x 1.9 cm was identified in the right hepatic dome. The lesion was not characterized on the non-contrast study. The scan was performed  without contrast due to renal considerations. He denies abdominal pain or gastrointestinal symptoms.     MEDICAL HISTORY: Past Medical History:  Diagnosis Date   Abnormal results of kidney function studies    Abnormal weight loss    Disorder of kidney and ureter    Essential hypertension    Hereditary and idiopathic neuropathy    Hypercalcemia    Iron deficiency anemia    Mixed hyperlipidemia    Mononeuritis multiplex    Tobacco use    Type 2 diabetes mellitus (HCC)     ALLERGIES:  has no known allergies.  MEDICATIONS:  Current Outpatient Medications  Medication Sig Dispense Refill   amLODipine (NORVASC) 10 MG tablet Take 10 mg by mouth daily.      amoxicillin  (AMOXIL ) 500 MG tablet 2 PO BID FOR 10 DAYS 40 tablet 0   aspirin EC 81 MG tablet Take 81 mg by mouth daily.     atorvastatin (LIPITOR) 40 MG tablet Take 40 mg by mouth daily.     Choline Fenofibrate (FENOFIBRIC ACID) 135 MG CPDR Take 135 mg by mouth daily.      ezetimibe (ZETIA) 10 MG tablet Take 1 tablet by mouth daily.     FARXIGA 10 MG TABS tablet TAKE 1 TABLET BY MOUTH DAILY FOR PROTEIN IN URINE     ferrous sulfate 325 (65 FE) MG tablet Take 325 mg by mouth daily with breakfast.     gabapentin (NEURONTIN) 800 MG tablet Take 800 mg by mouth at bedtime.      Iron-FA-B Cmp-C-Biot-Probiotic (FUSION PLUS) CAPS Take 1 capsule by mouth daily.     losartan (COZAAR)  100 MG tablet Take 100 mg by mouth daily.      metFORMIN (GLUCOPHAGE) 1000 MG tablet Take 1,000 mg by mouth daily with breakfast.      omeprazole  (PRILOSEC) 20 MG capsule TAKE ONE CAPSULE BY MOUTH 30 MINUTES PRIOR TO BREAKFAST AND SUPPER 180 capsule 0   simvastatin (ZOCOR) 40 MG tablet Take 40 mg by mouth daily.      VELTASSA 8.4 g packet 8.4 g. (Patient taking differently: 8.4 g 2 (two) times a week.)     No current facility-administered medications for this visit.    SURGICAL HISTORY:  Past Surgical History:  Procedure Laterality Date   COLONOSCOPY N/A  11/01/2016   Procedure: COLONOSCOPY;  Surgeon: Eric LITTIE Haddock, MD;  Location: [facility];  Service: Endoscopy;  Laterality: N/A;  1:45pm   ENDOBRONCHIAL ULTRASOUND Bilateral 03/29/2024   Procedure: ENDOBRONCHIAL ULTRASOUND (EBUS);  Surgeon: Eric Hose, MD;  Location: [facility] END[facility]COPY;  Service: Pulmonary;  Laterality: Bilateral;   ESOPHAGOGASTRODUODENOSCOPY N/A 11/01/2016   Procedure: ESOPHAGOGASTRODUODENOSCOPY (EGD);  Surgeon: Eric LITTIE Haddock, MD;  Location: [facility];  Service: Endoscopy;  Laterality: N/A;   LAPAROSCOPIC APPENDECTOMY N/A 1975   VIDEO BRONCHOSCOPY WITH ENDOBRONCHIAL NAVIGATION Bilateral 03/29/2024   Procedure: VIDEO BRONCHOSCOPY WITH ENDOBRONCHIAL NAVIGATION;  Surgeon: Eric Hose, MD;  Location: [facility];  Service: Pulmonary;  Laterality: Bilateral;    REVIEW OF SYSTEMS:  Constitutional: negative Eyes: negative Ears, nose, mouth, throat, and face: negative Respiratory: negative Cardiovascular: negative Gastrointestinal: negative Genitourinary:negative Integument/breast: negative Hematologic/lymphatic: negative Musculoskeletal:negative Neurological: negative Behavioral/Psych: negative Endocrine: negative Allergic/Immunologic: negative   PHYSICAL EXAMINATION: General appearance: alert, cooperative, fatigued, and no distress Head: Normocephalic, without obvious abnormality, atraumatic Neck: no adenopathy, no JVD, supple, symmetrical, trachea midline, and thyroid not enlarged, symmetric, no tenderness/mass/nodules Lymph nodes: Cervical, supraclavicular, and axillary nodes normal. Resp: clear to auscultation bilaterally Back: symmetric, no curvature. ROM normal. No CVA tenderness. Cardio: regular rate and rhythm, S1, S2 normal, no murmur, click, rub or gallop GI: soft, non-tender; bowel sounds normal; no masses,  no organomegaly Extremities: extremities normal, atraumatic, no cyanosis or edema Neurologic: Alert and oriented X 3, normal strength and tone. Normal  symmetric reflexes. Normal coordination and gait  ECOG PERFORMANCE STATUS: 1 - Symptomatic but completely ambulatory  Blood pressure 120/62, pulse 74, temperature 97.6 F (36.4 C), temperature source Temporal, resp. rate 17, height 6' (1.829 m), weight 143 lb (64.9 kg), SpO2 98%.  LABORATORY DATA: Lab Results  Component Value Date   WBC 5.8 08/20/2024   HGB 10.5 (L) 08/20/2024   HCT 32.3 (L) 08/20/2024   MCV 83.2 08/20/2024   PLT 231 08/20/2024      Chemistry      Component Value Date/Time   NA 139 08/20/2024 1252   K 4.9 08/20/2024 1252   CL 105 08/20/2024 1252   CO2 23 08/20/2024 1252   BUN 23 08/20/2024 1252   CREATININE 1.68 (H) 08/20/2024 1252      Component Value Date/Time   CALCIUM 9.3 08/20/2024 1252   ALKPHOS 82 08/20/2024 1252   AST 21 08/20/2024 1252   ALT 16 08/20/2024 1252   BILITOT 0.3 08/20/2024 1252       RADIOGRAPHIC STUDIES: CT Chest Wo Contrast Result Date: 08/21/2024 CLINICAL DATA:  Non-small cell lung cancer (NSCLC), staging. * Tracking Code: BO * EXAM: CT CHEST WITHOUT CONTRAST TECHNIQUE: Multidetector CT imaging of the chest was performed following the standard protocol without IV contrast. RADIATION DOSE REDUCTION: This exam was performed according to the departmental  dose-optimization program which includes automated exposure control, adjustment of the mA and/or kV according to patient size and/or use of iterative reconstruction technique. COMPARISON:  Nuclear medicine PET scan from 03/28/2024 and CT scan chest from 03/13/2024. FINDINGS: Cardiovascular: Normal cardiac size. No pericardial effusion. No aortic aneurysm. There are coronary artery calcifications, in keeping with coronary artery disease. There are also moderate to severe peripheral atherosclerotic vascular calcifications of thoracic aorta and its major branches. Mediastinum/Nodes: Visualized thyroid gland appears grossly unremarkable. No solid / cystic mediastinal masses. The esophagus is  nondistended precluding optimal assessment. There are few mildly prominent mediastinal lymph nodes, which do not meet the size criteria for lymphadenopathy and appear grossly similar to the prior study, favoring benign etiology. No axillary lymphadenopathy by size criteria. Evaluation of bilateral hila is limited due to lack on intravenous contrast: however, no large hilar lymphadenopathy identified. Lungs/Pleura: The central tracheo-bronchial tree is patent. There is mild, smooth, circumferential thickening of the segmental and subsegmental bronchial walls, throughout bilateral lungs, which is nonspecific. Findings are most commonly seen with bronchitis or reactive airway disease, such as asthma. Redemonstration of moderate upper lobe predominant emphysematous changes. Again seen is an irregular spiculated marginated solid mass in the posterior segment of right upper lobe abutting the right major fissure currently measuring 1.8 x 3.3 cm, previously 3.9 x 5.0 cm. No new mass or consolidation. No pleural effusion or pneumothorax. No suspicious lung nodules. Upper Abdomen: There is a new subcapsular hypoattenuating area in the right hepatic dome, segment 8 measuring approximately 1.5 x 1.9 cm, which is not well characterized on this exam but does not have attenuation of a simple cyst and also new since the prior study. In view of history of malignancy, this is concerning for neoplastic process. Further evaluation with multiphasic contrast-enhanced MRI abdomen as per liver mass protocol is recommended. Remaining visualized upper abdominal viscera are within normal limits. Musculoskeletal: Redemonstration of well-circumscribed 3.5 x 6.5 cm soft tissue attenuation area in the right posterior lateral chest wall subcutaneous tissue, unchanged. The visualized soft tissues of the chest wall are otherwise grossly unremarkable. No suspicious osseous lesions. There are mild multilevel degenerative changes in the visualized  spine. IMPRESSION: 1. Interval decrease in the size of right upper lobe mass, as described above. No new lung mass or consolidation. No suspicious lung nodule. No thoracic lymphadenopathy by size criteria. 2. There is a new subcapsular hypoattenuating area in the right hepatic dome, segment 8 measuring approximately 1.5 x 1.9 cm, which is not well characterized on this exam but does not have attenuation of a simple cyst and also new since the prior study. In view of history of malignancy, this is concerning for neoplastic process. Further evaluation with multiphasic contrast-enhanced MRI abdomen as per liver mass protocol is recommended. 3. Multiple other nonacute observations, as described above. Aortic Atherosclerosis (ICD10-I70.0) and Emphysema (ICD10-J43.9). Electronically Signed   By: Ree Molt M.D.   On: 08/21/2024 11:00    ASSESSMENT AND PLAN: This is a very pleasant 83 years old white male with stage IIb/IV (T2b, N1, M0/M1 a) non-small cell lung cancer, squamous cell carcinoma presented with large right upper lobe lung mass in addition to right hilar lymphadenopathy seen on the PET scan.  His previous CT scan of the chest showed satellite pulmonary nodules in addition to suspicious mediastinal lymphadenopathy that were not active on the PET scan.  This was diagnosed in July 2025.  He also has several parotid gland hypermetabolic lesions likely a different process but  metastatic disease could not be completely excluded.  The patient underwent SBRT to the right upper lobe lung mass under the care of Dr. Dewey.  This was completed on June 01, 2024. He is currently on observation. He had repeat CT scan of the chest performed recently.  I personally independently reviewed the scan images and discussed the result and showed the images to the patient and his daughter. His scan showed improvement of the right upper lobe mass but there was a new subcapsular hypoattenuating area in the right hepatic dome  that is not characterized and MRI was recommended for further evaluation. Assessment and Plan Assessment & Plan Stage IIa non-small cell lung cancer, squamous cell carcinoma Status post stereotactic body radiation therapy completed October 2025. Recent chest CT demonstrates significant reduction in tumor size without evidence of recurrence or new lesions. He remains asymptomatic with preserved functional status. Ongoing radiation effect anticipated; no acute complications identified. - Ordered repeat chest CT to monitor for disease recurrence. - Plan to follow up in four months with repeat chest imaging if interim MRI is negative and no new concerning findings.  Abnormal liver imaging finding, under evaluation New subcapsular hypoattenuating lesion (1.5 x 1.9 cm) in the right hepatic dome identified on non-contrast CT. Indeterminate etiology due to imaging limitations from renal dysfunction; differential includes benign versus malignant process. Further characterization required. - Ordered abdominal MRI for further evaluation of the indeterminate hepatic lesion. - If MRI is negative and lesion is benign or nonspecific, continue routine surveillance. - If MRI demonstrates concerning features, expedite follow-up and consider biopsy. The patient was advised to call immediately if he has any other concerning symptoms in the interval.  The patient voices understanding of current disease status and treatment options and is in agreement with the current care plan.  All questions were answered. The patient knows to call the clinic with any problems, questions or concerns. We can certainly see the patient much sooner if necessary.  The total time spent in the appointment was 30 minutes including review of chart and various tests results, discussions about plan of care and coordination of care plan .   Disclaimer: This note was dictated with voice recognition software. Similar sounding words can  inadvertently be transcribed and may not be corrected upon review.        "

## 2024-09-04 ENCOUNTER — Ambulatory Visit: Payer: Self-pay | Admitting: Internal Medicine

## 2024-09-04 ENCOUNTER — Ambulatory Visit (HOSPITAL_COMMUNITY)
Admission: RE | Admit: 2024-09-04 | Discharge: 2024-09-04 | Disposition: A | Discharge status: Home / Self Care | Source: Ambulatory Visit | Attending: Internal Medicine | Admitting: Internal Medicine

## 2024-09-04 DIAGNOSIS — K769 Liver disease, unspecified: Secondary | ICD-10-CM | POA: Insufficient documentation

## 2024-09-04 MED ORDER — GADOBUTROL 1 MMOL/ML IV SOLN
6.0000 mL | Freq: Once | INTRAVENOUS | Status: AC | PRN
  Administered 2024-09-04: 6 mL via INTRAVENOUS

## 2024-09-05 ENCOUNTER — Telehealth: Payer: Self-pay | Admitting: Internal Medicine

## 2024-09-05 ENCOUNTER — Telehealth: Payer: Self-pay | Admitting: Medical Oncology

## 2024-09-05 NOTE — Telephone Encounter (Signed)
 What is reason for pt appt on 01/12? I told dtr so Dr. Sherrod can review pt scan results.

## 2024-09-10 ENCOUNTER — Inpatient Hospital Stay

## 2024-09-10 ENCOUNTER — Inpatient Hospital Stay: Attending: Internal Medicine | Admitting: Internal Medicine

## 2024-09-10 VITALS — BP 136/57 | HR 70 | Temp 98.2°F | Resp 17 | Ht 72.0 in | Wt 146.0 lb

## 2024-09-10 DIAGNOSIS — Z7982 Long term (current) use of aspirin: Secondary | ICD-10-CM | POA: Diagnosis not present

## 2024-09-10 DIAGNOSIS — Z923 Personal history of irradiation: Secondary | ICD-10-CM | POA: Insufficient documentation

## 2024-09-10 DIAGNOSIS — C3490 Malignant neoplasm of unspecified part of unspecified bronchus or lung: Secondary | ICD-10-CM | POA: Diagnosis not present

## 2024-09-10 DIAGNOSIS — Z79899 Other long term (current) drug therapy: Secondary | ICD-10-CM | POA: Insufficient documentation

## 2024-09-10 DIAGNOSIS — C3411 Malignant neoplasm of upper lobe, right bronchus or lung: Secondary | ICD-10-CM | POA: Insufficient documentation

## 2024-09-10 NOTE — Progress Notes (Signed)
 "     Eric Ochoa   [phone]  OFFICE PROGRESS NOTE  Eric Ochoa, Eric Ochoa  DIAGNOSIS: stage IIA (T2b, N0, M0) non-small cell lung cancer, squamous cell carcinoma presented with large right upper lobe lung mass in addition to right hilar lymphadenopathy seen on the PET scan.  His previous CT scan of the chest showed satellite pulmonary nodules in addition to suspicious mediastinal lymphadenopathy that were not active on the PET scan.  This was diagnosed in July 2025.  He also has several parotid gland hypermetabolic lesions likely a different process but metastatic disease could not be completely excluded.   PRIOR THERAPY: Status post SBRT to the right upper lobe lung nodule under the care of Dr. Dewey completed on June 01, 2024.  CURRENT THERAPY: Observation  INTERVAL HISTORY: Eric Ochoa 84 y.o. male returns to the clinic today for follow-up visit accompanied by his daughter. Discussed the use of AI scribe software for clinical note transcription with the patient, who gave verbal consent to proceed.  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  He was diagnosed with stage 2A squamous cell carcinoma of the right upper lobe in July 2025 and underwent stereotactic body radiation therapy in October 2025, followed by observation. Recent CT imaging identified a new subcapsular hypoattenuating area in the right hepatic dome, and MRI on September 04, 2024, revealed three suspicious hepatic lesions.  A family member has observed mild abdominal distension over the past three to six months and describes a malodorous breath with a 'chemical' or 'sickening' quality, not attributable to oral hygiene. He does not currently  consume alcohol and only drank minimally as a teenager.     MEDICAL HISTORY: Past Medical History:  Diagnosis Date   Abnormal results of kidney function studies    Abnormal weight loss    Disorder of kidney and ureter    Essential hypertension    Hereditary and idiopathic neuropathy    Hypercalcemia    Iron deficiency anemia    Mixed hyperlipidemia    Mononeuritis multiplex    Tobacco use    Type 2 diabetes mellitus (HCC)     ALLERGIES:  has no known allergies.  MEDICATIONS:  Current Outpatient Medications  Medication Sig Dispense Refill   amLODipine (NORVASC) 10 MG tablet Take 10 mg by mouth daily.      amoxicillin  (AMOXIL ) 500 MG tablet 2 PO BID FOR 10 DAYS 40 tablet 0   aspirin EC 81 MG tablet Take 81 mg by mouth daily.     atorvastatin (LIPITOR) 40 MG tablet Take 40 mg by mouth daily.     Choline Fenofibrate (FENOFIBRIC ACID) 135 MG CPDR Take 135 mg by mouth daily.      ezetimibe (ZETIA) 10 MG tablet Take 1 tablet by mouth daily.     FARXIGA 10 MG TABS tablet TAKE 1 TABLET BY MOUTH DAILY FOR PROTEIN IN URINE     ferrous sulfate 325 (65 FE) MG tablet Take 325 mg by mouth daily with breakfast.     gabapentin (NEURONTIN) 800 MG tablet Take 800 mg by mouth at bedtime.      Iron-FA-B Cmp-C-Biot-Probiotic (FUSION PLUS) CAPS Take 1 capsule by mouth daily.     losartan (COZAAR)  100 MG tablet Take 100 mg by mouth daily.      metFORMIN (GLUCOPHAGE) 1000 MG tablet Take 1,000 mg by mouth daily with breakfast.      omeprazole  (PRILOSEC) 20 MG capsule TAKE ONE CAPSULE BY MOUTH 30 MINUTES PRIOR TO BREAKFAST AND SUPPER 180 capsule 0   simvastatin (ZOCOR) 40 MG tablet Take 40 mg by mouth daily.      VELTASSA 8.4 g packet 8.4 g. (Patient taking differently: 8.4 g 2 (two) times a week.)     No current facility-administered medications for this visit.    SURGICAL HISTORY:  Past Surgical History:  Procedure Laterality Date   COLONOSCOPY N/A 11/01/2016   Procedure: COLONOSCOPY;  Surgeon:  Margo LITTIE Haddock, Eric;  Location: [facility];  Service: Endoscopy;  Laterality: N/A;  1:45pm   ENDOBRONCHIAL ULTRASOUND Bilateral 03/29/2024   Procedure: ENDOBRONCHIAL ULTRASOUND (EBUS);  Surgeon: Isadora Hose, Eric;  Location: [facility] END[facility];  Service: Pulmonary;  Laterality: Bilateral;   ESOPHAGOGASTRODUODENOSCOPY N/A 11/01/2016   Procedure: ESOPHAGOGASTRODUODENOSCOPY (EGD);  Surgeon: Margo LITTIE Haddock, Eric;  Location: [facility];  Service: Endoscopy;  Laterality: N/A;   LAPAROSCOPIC APPENDECTOMY N/A 1975   VIDEO BRONCHOSCOPY WITH ENDOBRONCHIAL NAVIGATION Bilateral 03/29/2024   Procedure: VIDEO BRONCHOSCOPY WITH ENDOBRONCHIAL NAVIGATION;  Surgeon: Isadora Hose, Eric;  Location: [facility];  Service: Pulmonary;  Laterality: Bilateral;    REVIEW OF SYSTEMS:  Constitutional: positive for fatigue Eyes: negative Ears, nose, mouth, throat, and face: negative Respiratory: negative Cardiovascular: negative Gastrointestinal: negative Genitourinary:negative Integument/breast: negative Hematologic/lymphatic: negative Musculoskeletal:negative Neurological: negative Behavioral/Psych: negative Endocrine: negative Allergic/Immunologic: negative   PHYSICAL EXAMINATION: General appearance: alert, cooperative, fatigued, and no distress Head: Normocephalic, without obvious abnormality, atraumatic Neck: no adenopathy, no JVD, supple, symmetrical, trachea midline, and thyroid not enlarged, symmetric, no tenderness/mass/nodules Lymph nodes: Cervical, supraclavicular, and axillary nodes normal. Resp: clear to auscultation bilaterally Back: symmetric, no curvature. ROM normal. No CVA tenderness. Cardio: regular rate and rhythm, S1, S2 normal, no murmur, click, rub or gallop GI: soft, non-tender; bowel sounds normal; no masses,  no organomegaly Extremities: extremities normal, atraumatic, no cyanosis or edema Neurologic: Alert and oriented X 3, normal strength and tone. Normal symmetric reflexes. Normal  coordination and gait  ECOG PERFORMANCE STATUS: 1 - Symptomatic but completely ambulatory  Blood pressure (!) 136/57, pulse 70, temperature 98.2 F (36.8 C), temperature source Temporal, resp. rate 17, height 6' (1.829 m), weight 146 lb (66.2 kg), SpO2 97%.  LABORATORY DATA: Lab Results  Component Value Date   WBC 5.8 08/20/2024   HGB 10.5 (L) 08/20/2024   HCT 32.3 (L) 08/20/2024   MCV 83.2 08/20/2024   PLT 231 08/20/2024      Chemistry      Component Value Date/Time   NA 139 08/20/2024 1252   K 4.9 08/20/2024 1252   CL 105 08/20/2024 1252   CO2 23 08/20/2024 1252   BUN 23 08/20/2024 1252   CREATININE 1.68 (H) 08/20/2024 1252      Component Value Date/Time   CALCIUM 9.3 08/20/2024 1252   ALKPHOS 82 08/20/2024 1252   AST 21 08/20/2024 1252   ALT 16 08/20/2024 1252   BILITOT 0.3 08/20/2024 1252       RADIOGRAPHIC STUDIES: MR LIVER W WO CONTRAST Result Date: 09/04/2024 CLINICAL DATA:  Liver lesion, > 1cm. EXAM: MRI ABDOMEN WITHOUT AND WITH CONTRAST TECHNIQUE: Multiplanar multisequence MR imaging of the abdomen was performed both before and after the administration of intravenous contrast. CONTRAST:  6mL GADAVIST  GADOBUTROL  1 MMOL/ML IV SOLN  COMPARISON:  CT scan chest from 08/20/2024. FINDINGS: The technologist noted patient was hard of hearing and had trouble following to breathing instructions. Best obtainable images. Lower chest: Unremarkable MR appearance to the lung bases. No pleural effusion. No pericardial effusion. Normal heart size. Hepatobiliary: The liver is normal in size. Noncirrhotic configuration. Examination is moderately limited due to significant motion during data acquisition. However, there are at least 3, mildly T2 hyperintense lesions in the liver with largest in the subcapsular left hepatic lobe, segment 3 measuring 1.8 x 2.7 cm. The second largest lesion is in the right hepatic dome, segment 8, which corresponds to the lesion seen on the recent chest CT scan.  These lesions exhibit target like enhancement pattern on the postcontrast images and are compatible with metastases. No intrahepatic or extrahepatic bile duct dilatation. No choledocholithiasis. Unremarkable gallbladder. Pancreas: No mass, inflammatory changes or other parenchymal abnormality identified. No main pancreatic duct dilation. Spleen:  Within normal limits in size and appearance. No focal mass. Adrenals/Urinary Tract: Unremarkable adrenal glands. No hydroureteronephrosis. No suspicious renal mass. Stomach/Bowel: Visualized portions within the abdomen are unremarkable. No disproportionate dilation of bowel loops. Vascular/Lymphatic: No pathologically enlarged lymph nodes identified. No abdominal aortic aneurysm demonstrated. No ascites. Other: There is a well-circumscribed 4.8 x 8.5 cm T1 hypointense and T2 hyperintense nonenhancing structure in the subcutaneous tissue over the right posterolateral chest wall, incompletely characterized on the current exam but favored to represent an epidermal inclusion cyst. Correlate clinically. Musculoskeletal: No suspicious bone lesions identified. IMPRESSION: 1. There are at least 3, liver lesions, as described above, compatible with metastases. 2. Multiple other nonacute observations, as described above. Electronically Signed   By: Ree Molt M.D.   On: 09/04/2024 13:20   CT Chest Wo Contrast Result Date: 08/21/2024 CLINICAL DATA:  Non-small cell lung cancer (NSCLC), staging. * Tracking Code: BO * EXAM: CT CHEST WITHOUT CONTRAST TECHNIQUE: Multidetector CT imaging of the chest was performed following the standard protocol without IV contrast. RADIATION DOSE REDUCTION: This exam was performed according to the departmental dose-optimization program which includes automated exposure control, adjustment of the mA and/or kV according to patient size and/or use of iterative reconstruction technique. COMPARISON:  Nuclear medicine PET scan from 03/28/2024 and CT scan  chest from 03/13/2024. FINDINGS: Cardiovascular: Normal cardiac size. No pericardial effusion. No aortic aneurysm. There are coronary artery calcifications, in keeping with coronary artery disease. There are also moderate to severe peripheral atherosclerotic vascular calcifications of thoracic aorta and its major branches. Mediastinum/Nodes: Visualized thyroid gland appears grossly unremarkable. No solid / cystic mediastinal masses. The esophagus is nondistended precluding optimal assessment. There are few mildly prominent mediastinal lymph nodes, which do not meet the size criteria for lymphadenopathy and appear grossly similar to the prior study, favoring benign etiology. No axillary lymphadenopathy by size criteria. Evaluation of bilateral hila is limited due to lack on intravenous contrast: however, no large hilar lymphadenopathy identified. Lungs/Pleura: The central tracheo-bronchial tree is patent. There is mild, smooth, circumferential thickening of the segmental and subsegmental bronchial walls, throughout bilateral lungs, which is nonspecific. Findings are most commonly seen with bronchitis or reactive airway disease, such as asthma. Redemonstration of moderate upper lobe predominant emphysematous changes. Again seen is an irregular spiculated marginated solid mass in the posterior segment of right upper lobe abutting the right major fissure currently measuring 1.8 x 3.3 cm, previously 3.9 x 5.0 cm. No new mass or consolidation. No pleural effusion or pneumothorax. No suspicious lung nodules. Upper Abdomen: There is a new  subcapsular hypoattenuating area in the right hepatic dome, segment 8 measuring approximately 1.5 x 1.9 cm, which is not well characterized on this exam but does not have attenuation of a simple cyst and also new since the prior study. In view of history of malignancy, this is concerning for neoplastic process. Further evaluation with multiphasic contrast-enhanced MRI abdomen as per liver  mass protocol is recommended. Remaining visualized upper abdominal viscera are within normal limits. Musculoskeletal: Redemonstration of well-circumscribed 3.5 x 6.5 cm soft tissue attenuation area in the right posterior lateral chest wall subcutaneous tissue, unchanged. The visualized soft tissues of the chest wall are otherwise grossly unremarkable. No suspicious osseous lesions. There are mild multilevel degenerative changes in the visualized spine. IMPRESSION: 1. Interval decrease in the size of right upper lobe mass, as described above. No new lung mass or consolidation. No suspicious lung nodule. No thoracic lymphadenopathy by size criteria. 2. There is a new subcapsular hypoattenuating area in the right hepatic dome, segment 8 measuring approximately 1.5 x 1.9 cm, which is not well characterized on this exam but does not have attenuation of a simple cyst and also new since the prior study. In view of history of malignancy, this is concerning for neoplastic process. Further evaluation with multiphasic contrast-enhanced MRI abdomen as per liver mass protocol is recommended. 3. Multiple other nonacute observations, as described above. Aortic Atherosclerosis (ICD10-I70.0) and Emphysema (ICD10-J43.9). Electronically Signed   By: Ree Molt M.D.   On: 08/21/2024 11:00    ASSESSMENT AND PLAN: This is a very pleasant 84 years old white male with stage IIb/IV (T2b, N1, M0/M1 a) non-small cell lung cancer, squamous cell carcinoma presented with large right upper lobe lung mass in addition to right hilar lymphadenopathy seen on the PET scan.  His previous CT scan of the chest showed satellite pulmonary nodules in addition to suspicious mediastinal lymphadenopathy that were not active on the PET scan.  This was diagnosed in July 2025.  He also has several parotid gland hypermetabolic lesions likely a different process but metastatic disease could not be completely excluded.  The patient underwent SBRT to the  right upper lobe lung mass under the care of Dr. Dewey.  This was completed on June 01, 2024. He is currently on observation. He had repeat CT scan of the chest performed recently.  I personally independently reviewed the scan images and discussed the result and showed the images to the patient and his daughter. His scan showed improvement of the right upper lobe mass but there was a new subcapsular hypoattenuating area in the right hepatic dome that is not characterized and MRI was recommended for further evaluation. He had MRI of the liver performed on September 04, 2024 and it showed at least 3 liver lesion compatible with metastasis. 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 Differential includes metastasis from the known lung primary versus a new primary malignancy, such as hepatocellular carcinoma or gastrointestinal tract cancer, though primary liver cancer is less likely given the absence of significant risk factors. Multiple liver lesions would upstage his disease to stage IV if confirmed as metastases. He remains asymptomatic from the liver lesions, without hepatomegaly or hepatic insufficiency. Prompt tissue diagnosis and staging are necessary to guide management and prevent further metastatic spread. - Ordered ultrasound-guided liver biopsy of  the most accessible hepatic lesion for tissue diagnosis. - Ordered PET scan to assess for additional metastatic sites and further characterize hepatic lesions. - Provided education regarding the necessity of prompt diagnosis and staging, emphasizing the risk of further metastatic progression if treatment is delayed. - Discussed potential future systemic therapy options, including chemotherapy and  immunotherapy, contingent on biopsy results. Chemotherapy may cause alopecia, nausea, vomiting, diarrhea, and peripheral neuropathy; immunotherapy is generally better tolerated. Typical regimen consists of chemotherapy plus immunotherapy every three weeks for four cycles, followed by maintenance immunotherapy. - Arranged follow-up in approximately three weeks to review biopsy and PET scan results and discuss subsequent management. GI patient was advised to call immediately if he has any concerning symptoms in the interval.  The patient voices understanding of current disease status and treatment options and is in agreement with the current care plan.  All questions were answered. The patient knows to call the clinic with any problems, questions or concerns. We can certainly see the patient much sooner if necessary.  The total time spent in the appointment was 30 minutes including review of chart and various tests results, discussions about plan of care and coordination of care plan .   Disclaimer: This note was dictated with voice recognition software. Similar sounding words can inadvertently be transcribed and may not be corrected upon review.        "

## 2024-09-11 ENCOUNTER — Telehealth: Payer: Self-pay | Admitting: Internal Medicine

## 2024-09-11 ENCOUNTER — Encounter (HOSPITAL_COMMUNITY): Payer: Self-pay

## 2024-09-11 NOTE — Telephone Encounter (Signed)
 Called pt their daughter answered and wanted the appt to be on 2/2 because 2/3 was too late and 2/4 was the anniversary of her mothers passing they are aware of the appt scheduled on 2/2

## 2024-09-11 NOTE — Progress Notes (Signed)
 This NN reached out to the pt's dtr, Ellouise, to make self introduction and explain the role of a NN in the pts care continuum.  The plan for the pt is a PET scan and US  biopsy of liver lesions.  At this time, the pt has no barriers, however NN will reevaluate at next f/u appt when a treatment plan is in place.  NN provided pt with direct contact information and encouraged pt call with any questions or concerns.

## 2024-09-11 NOTE — Progress Notes (Signed)
 Jenna Cordella LABOR, MD  Michaelene Setter PROCEDURE / BIOPSY REVIEW Date: 09/11/2024  Requested Biopsy site: liver Reason for request: liver mass Imaging review: Best seen on MR  Decision: Approved Imaging modality to perform: Ultrasound Schedule with: Moderate Sedation Schedule for: Any VIR  Additional comments: @Schedulers .  Please contact me with questions, concerns, or if issue pertaining to this request arise.  Cordella LABOR Jenna, MD [facility]       Previous Messages    ----- Message ----- From: Sherrina Zaugg Sent: 09/10/2024  12:59 PM EST To: Devonia Farro; Ir Procedure Requests Subject: US  biopsy liver                                Procedure : US  liver biopsy  Reason : Ultrasound-guided core biopsy of one of the most accessible liver lesion seen recently on the MRI Dx: Malignant neoplasm of unspecified part of unspecified bronchus or lung (HCC) [C34.90 (ICD-10-CM)]    History : MR liver w/wo , CT chest wo  Provider : Sherrod Sherrod, MD  Contact : [phone]

## 2024-09-12 ENCOUNTER — Encounter (HOSPITAL_COMMUNITY)
Admission: RE | Admit: 2024-09-12 | Discharge: 2024-09-12 | Disposition: A | Discharge status: Home / Self Care | Source: Ambulatory Visit | Attending: Internal Medicine | Admitting: Internal Medicine

## 2024-09-12 DIAGNOSIS — C3490 Malignant neoplasm of unspecified part of unspecified bronchus or lung: Secondary | ICD-10-CM | POA: Diagnosis present

## 2024-09-12 LAB — GLUCOSE, CAPILLARY: Glucose-Capillary: 105 mg/dL — ABNORMAL HIGH (ref 70–99)

## 2024-09-12 MED ORDER — FLUDEOXYGLUCOSE F - 18 (FDG) INJECTION
7.3000 | Freq: Once | INTRAVENOUS | Status: AC
  Administered 2024-09-12: 7.3 via INTRAVENOUS

## 2024-09-13 ENCOUNTER — Ambulatory Visit: Admitting: Podiatry

## 2024-09-13 ENCOUNTER — Encounter: Payer: Self-pay | Admitting: Podiatry

## 2024-09-13 ENCOUNTER — Encounter (HOSPITAL_COMMUNITY): Payer: Self-pay

## 2024-09-13 DIAGNOSIS — B351 Tinea unguium: Secondary | ICD-10-CM | POA: Diagnosis not present

## 2024-09-13 DIAGNOSIS — E1142 Type 2 diabetes mellitus with diabetic polyneuropathy: Secondary | ICD-10-CM

## 2024-09-13 DIAGNOSIS — M79675 Pain in left toe(s): Secondary | ICD-10-CM | POA: Diagnosis not present

## 2024-09-13 DIAGNOSIS — M79674 Pain in right toe(s): Secondary | ICD-10-CM

## 2024-09-13 NOTE — Progress Notes (Signed)
 Jenna Cordella LABOR, MD  Chavie Kolinski Yes with sips, he can take other meds       Previous Messages    ----- Message ----- From: Zyon Rosser Sent: 09/13/2024   9:09 AM EST To: Cordella LABOR Jenna, MD Subject: RE: US  biopsy liver                            Pt is holding off on aspirin 5 days prior. Daughter wondering if he can taking his other meds on his med list in the morning with a small sip of water  or if he needs to hold off .   Thanks , Eleanor MANIFOLD ----- Message ----- From: Jenna Cordella LABOR, MD Sent: 09/11/2024  12:09 PM EST To: Eleanor Mighty Subject: RE: US  biopsy liver                            PROCEDURE / BIOPSY REVIEW Date: 09/11/2024  Requested Biopsy site: liver Reason for request: liver mass Imaging review: Best seen on MR  Decision: Approved Imaging modality to perform: Ultrasound Schedule with: Moderate Sedation Schedule for: Any VIR  Additional comments: @Schedulers .  Please contact me with questions, concerns, or if issue pertaining to this request arise.

## 2024-09-13 NOTE — Progress Notes (Signed)
 This patient presents to the office for treatment of his long thick nails.  Patient is diabetic.  He also has thick great toenails both feet.  He presents for nail care.    General Appearance  Alert, conversant and in no acute stress.  Vascular  Dorsalis pedis and posterior tibial  pulses are  weakly palpable  bilaterally.  Capillary return is within normal limits  bilaterally. Cold feet.  Venous stasis  B/L. bilaterally.  Neurologic  Senn-Weinstein monofilament wire test absent   bilaterally. Muscle power within normal limits bilaterally.  Nails Thick disfigured discolored nails with subungual debris  hallux nails bilaterally. No evidence of bacterial infection or drainage bilaterally.  Orthopedic  No limitations of motion  feet .  No crepitus or effusions noted.  No bony pathology or digital deformities noted.  HAV  B/L.  Skin  normotropic skin with no porokeratosis noted bilaterally.  No signs of infections or ulcers noted.  Healing tip fourth toe with no  infection, redness  or swelling.    Onychomycosis Hallux  B/L.  ROV  Debride nails with nail nipper and dremel tool. RTC 3 months    Cordella Bold DPM

## 2024-09-20 ENCOUNTER — Other Ambulatory Visit: Payer: Self-pay | Admitting: Radiology

## 2024-09-20 DIAGNOSIS — K769 Liver disease, unspecified: Secondary | ICD-10-CM

## 2024-09-21 ENCOUNTER — Other Ambulatory Visit: Payer: Self-pay

## 2024-09-21 ENCOUNTER — Ambulatory Visit (HOSPITAL_COMMUNITY)
Admission: RE | Admit: 2024-09-21 | Discharge: 2024-09-21 | Disposition: A | Discharge status: Home / Self Care | Source: Ambulatory Visit | Attending: Internal Medicine | Admitting: Internal Medicine

## 2024-09-21 ENCOUNTER — Encounter (HOSPITAL_COMMUNITY): Payer: Self-pay

## 2024-09-21 ENCOUNTER — Ambulatory Visit (HOSPITAL_COMMUNITY)
Admission: RE | Admit: 2024-09-21 | Discharge: 2024-09-21 | Disposition: A | Source: Ambulatory Visit | Attending: Internal Medicine

## 2024-09-21 DIAGNOSIS — K769 Liver disease, unspecified: Secondary | ICD-10-CM

## 2024-09-21 DIAGNOSIS — C787 Secondary malignant neoplasm of liver and intrahepatic bile duct: Secondary | ICD-10-CM | POA: Diagnosis not present

## 2024-09-21 DIAGNOSIS — F17210 Nicotine dependence, cigarettes, uncomplicated: Secondary | ICD-10-CM | POA: Diagnosis not present

## 2024-09-21 DIAGNOSIS — C3491 Malignant neoplasm of unspecified part of right bronchus or lung: Secondary | ICD-10-CM | POA: Diagnosis present

## 2024-09-21 DIAGNOSIS — C3490 Malignant neoplasm of unspecified part of unspecified bronchus or lung: Secondary | ICD-10-CM

## 2024-09-21 DIAGNOSIS — R160 Hepatomegaly, not elsewhere classified: Secondary | ICD-10-CM | POA: Diagnosis present

## 2024-09-21 LAB — CBC
HCT: 34.2 % — ABNORMAL LOW (ref 39.0–52.0)
Hemoglobin: 11 g/dL — ABNORMAL LOW (ref 13.0–17.0)
MCH: 27.7 pg (ref 26.0–34.0)
MCHC: 32.2 g/dL (ref 30.0–36.0)
MCV: 86.1 fL (ref 80.0–100.0)
Platelets: 243 K/uL (ref 150–400)
RBC: 3.97 MIL/uL — ABNORMAL LOW (ref 4.22–5.81)
RDW: 16.5 % — ABNORMAL HIGH (ref 11.5–15.5)
WBC: 6.9 K/uL (ref 4.0–10.5)
nRBC: 0 % (ref 0.0–0.2)

## 2024-09-21 LAB — PROTIME-INR
INR: 1 (ref 0.8–1.2)
Prothrombin Time: 13.6 s (ref 11.4–15.2)

## 2024-09-21 LAB — GLUCOSE, CAPILLARY: Glucose-Capillary: 89 mg/dL (ref 70–99)

## 2024-09-21 MED ORDER — FENTANYL CITRATE (PF) 100 MCG/2ML IJ SOLN
INTRAMUSCULAR | Status: AC
  Filled 2024-09-21: qty 2

## 2024-09-21 MED ORDER — SODIUM CHLORIDE 0.9 % IV SOLN: INTRAVENOUS | Status: DC

## 2024-09-21 MED ORDER — MIDAZOLAM HCL (PF) 2 MG/2ML IJ SOLN
INTRAMUSCULAR | Status: DC | PRN
  Administered 2024-09-21: 1 mg via INTRAVENOUS

## 2024-09-21 MED ORDER — MIDAZOLAM HCL 2 MG/2ML IJ SOLN
INTRAMUSCULAR | Status: AC
  Filled 2024-09-21: qty 2

## 2024-09-21 MED ORDER — GELATIN ABSORBABLE 12-7 MM EX MISC
CUTANEOUS | Status: AC
  Filled 2024-09-21: qty 1

## 2024-09-21 MED ORDER — FENTANYL CITRATE (PF) 100 MCG/2ML IJ SOLN
INTRAMUSCULAR | Status: DC | PRN
  Administered 2024-09-21: 50 ug via INTRAVENOUS

## 2024-09-21 NOTE — H&P (Signed)
 "     Chief Complaint: Patient was seen in consultation today for liver metastases  Referring Physician(s): Mohamed,Mohamed  Supervising Physician: Luverne Aran  Patient Status: [facility] - Out-pt  History of Present Illness: Eric Ochoa is a 84 y.o. male with history of non-small cell lung cancer, squamous cell carcinoma presented with large right upper lobe lung mass.  He is known to IR from recent parotid mass biopsy which did not reveal any malignant cells.  He underwent PET scan 09/12/24 which showed: 1. Interval decrease in size and metabolic activity of the right upper lobe mass, now measuring 3.2 x 1.9 cm, previously 4.6 x 3.2 cm. No significant metabolic activity. 2. New hypermetabolic right hilar lymph node considering metastatic lymph node. 3. New hypermetabolic lesion in the lateral segment of the left hepatic lobe consistent with lung cancer metastasis. 4. Bilateral nodules within the left and right parotid glands, intensely metabolic and unchanged from comparison exam, consistent with primary parotid neoplasms.  MR Liver obtained the week prior on 09/04/24 showed: 1. There are at least 3, liver lesions, as described above, compatible with metastases. 2. Multiple other nonacute observations, as described above.  IR consulted for liver lesion biopsy at the request of Dr. Sherrod.   Patient presents today in his usual state of health.  He has been NPO.  He does have post-procedure care and transportation arranged.   He is FULL CODE for procedural purposes today.   Past Medical History:  Diagnosis Date   Abnormal results of kidney function studies    Abnormal weight loss    Disorder of kidney and ureter    Essential hypertension    Hereditary and idiopathic neuropathy    Hypercalcemia    Iron deficiency anemia    Mixed hyperlipidemia    Mononeuritis multiplex    Tobacco use    Type 2 diabetes mellitus (HCC)     Past Surgical History:  Procedure Laterality Date    COLONOSCOPY N/A 11/01/2016   Procedure: COLONOSCOPY;  Surgeon: Margo LITTIE Haddock, MD;  Location: [facility];  Service: Endoscopy;  Laterality: N/A;  1:45pm   ENDOBRONCHIAL ULTRASOUND Bilateral 03/29/2024   Procedure: ENDOBRONCHIAL ULTRASOUND (EBUS);  Surgeon: Isadora Hose, MD;  Location: [facility] ENDOSCOPY;  Service: Pulmonary;  Laterality: Bilateral;   ESOPHAGOGASTRODUODENOSCOPY N/A 11/01/2016   Procedure: ESOPHAGOGASTRODUODENOSCOPY (EGD);  Surgeon: Margo LITTIE Haddock, MD;  Location: [facility];  Service: Endoscopy;  Laterality: N/A;   LAPAROSCOPIC APPENDECTOMY N/A 1975   VIDEO BRONCHOSCOPY WITH ENDOBRONCHIAL NAVIGATION Bilateral 03/29/2024   Procedure: VIDEO BRONCHOSCOPY WITH ENDOBRONCHIAL NAVIGATION;  Surgeon: Isadora Hose, MD;  Location: [facility] ENDOSCOPY;  Service: Pulmonary;  Laterality: Bilateral;    Allergies: Patient has no known allergies.  Medications: Prior to Admission medications  Medication Sig Start Date End Date Taking? Authorizing Provider  amLODipine (NORVASC) 10 MG tablet Take 10 mg by mouth daily.  10/05/16  Yes [provider]  atorvastatin (LIPITOR) 40 MG tablet Take 40 mg by mouth daily. 01/24/24  Yes [provider]  Choline Fenofibrate (FENOFIBRIC ACID) 135 MG CPDR Take 135 mg by mouth daily.  10/05/16  Yes [provider]  ezetimibe (ZETIA) 10 MG tablet Take 1 tablet by mouth daily. 05/19/23  Yes [provider]  FARXIGA 10 MG TABS tablet TAKE 1 TABLET BY MOUTH DAILY FOR PROTEIN IN URINE   Yes [provider]  gabapentin (NEURONTIN) 800 MG tablet Take 800 mg by mouth at bedtime.  10/05/16  Yes [provider]  Iron-FA-B  Cmp-C-Biot-Probiotic (FUSION PLUS) CAPS Take 1 capsule by mouth daily. 03/12/24  Yes [provider]  losartan (COZAAR) 100 MG tablet Take 100 mg by mouth daily.  09/06/16  Yes [provider]  metFORMIN (GLUCOPHAGE) 1000 MG tablet Take 1,000 mg by mouth daily with breakfast.  10/04/16  Yes [provider]  omeprazole  (PRILOSEC) 20 MG capsule TAKE ONE CAPSULE BY MOUTH 30 MINUTES PRIOR TO BREAKFAST AND SUPPER 06/01/19  Yes Ezzard Sonny RAMAN, PA-C  simvastatin (ZOCOR) 40 MG tablet Take 40 mg by mouth daily.  09/06/16  Yes [provider]  VELTASSA 8.4 g packet 8.4 g. Patient taking differently: 8.4 g 2 (two) times a week. 04/23/24  Yes [provider]  amoxicillin  (AMOXIL ) 500 MG tablet 2 PO BID FOR 10 DAYS 11/25/16   Fields, Sandi L, MD  aspirin EC 81 MG tablet Take 81 mg by mouth daily.    [provider]  ferrous sulfate 325 (65 FE) MG tablet Take 325 mg by mouth daily with breakfast.    [provider]     Family History  Problem Relation Age of Onset   Colon cancer Neg Hx     Social History   Socioeconomic History   Marital status: Widowed    Spouse name: Not on file   Number of children: Not on file   Years of education: Not on file   Highest education level: Not on file  Occupational History   Not on file  Tobacco Use   Smoking status: Every Day    Current packs/day: 0.50    Average packs/day: 0.7 packs/day for 33.1 years (24.0 ttl pk-yrs)    Types: Cigarettes    Start date: 2023   Smokeless tobacco: Never   Tobacco comments:    Smoked 2 ppd at his heaviest -- 03/28/24      Substance and Sexual Activity   Alcohol use: No   Drug use: No   Sexual activity: Not on file  Other Topics Concern   Not on file  Social History Narrative   Not on file   Social Drivers of Health   Tobacco Use: High Risk (09/21/2024)   Patient History    Smoking Tobacco Use: Every Day    Smokeless Tobacco Use: Never    Passive Exposure: Not on file  Financial Resource Strain: Not on file  Food Insecurity: No Food Insecurity (04/10/2024)   Epic    Worried About Programme Researcher, Broadcasting/film/video in the Last Year: Never true    Ran Out of Food in the Last Year: Never true  Transportation Needs: No Transportation Needs (04/10/2024)   Epic    Lack of Transportation  (Medical): No    Lack of Transportation (Non-Medical): No  Physical Activity: Not on file  Stress: Not on file  Social Connections: Not on file  Depression (PHQ2-9): Low Risk (04/26/2024)   Depression (PHQ2-9)    PHQ-2 Score: 0  Alcohol Screen: Not on file  Housing: Unknown (04/10/2024)   Epic    Unable to Pay for Housing in the Last Year: No    Number of Times Moved in the Last Year: Not on file    Homeless in the Last Year: No  Utilities: Not At Risk (04/10/2024)   Epic    Threatened with loss of utilities: No  Health Literacy: Not on file     Review of Systems: A 12 point ROS discussed and pertinent positives are indicated in the HPI above.  All  other systems are negative.  Review of Systems  Constitutional:  Negative for fatigue and fever.  Respiratory:  Negative for cough and shortness of breath.   Cardiovascular:  Negative for chest pain.  Gastrointestinal:  Negative for abdominal pain, nausea and vomiting.  Musculoskeletal:  Negative for back pain.  Psychiatric/Behavioral:  Negative for behavioral problems and confusion.     Vital Signs: BP (!) 122/57   Pulse 67   Temp 98 F (36.7 C) (Oral)   Resp 16   Ht 6' (1.829 m)   Wt 144 lb (65.3 kg)   SpO2 98%   BMI 19.53 kg/m   Physical Exam Vitals and nursing note reviewed.  Constitutional:      General: He is not in acute distress.    Appearance: Normal appearance. He is not ill-appearing.  HENT:     Mouth/Throat:     Mouth: Mucous membranes are moist.     Pharynx: Oropharynx is clear.  Cardiovascular:     Rate and Rhythm: Normal rate.  Pulmonary:     Effort: Pulmonary effort is normal. No respiratory distress.  Abdominal:     General: Abdomen is flat. There is no distension.     Palpations: Abdomen is soft.  Skin:    General: Skin is dry.  Neurological:     General: No focal deficit present.     Mental Status: He is oriented to person, place, and time. Mental status is at baseline.  Psychiatric:         Mood and Affect: Mood normal.        Behavior: Behavior normal.        Thought Content: Thought content normal.        Judgment: Judgment normal.      MD Evaluation Airway: WNL Heart: WNL Abdomen: WNL Chest/ Lungs: WNL ASA  Classification: 3 Mallampati/Airway Score: Two   Imaging: NM PET Image Restage (PS) Skull Base to Thigh (F-18 FDG) Result Date: 09/18/2024 EXAM: PET AND CT SKULL BASE TO MID THIGH 03/01/2024 TECHNIQUE: RADIOPHARMACEUTICAL: 7.28 mCi F-18 FDG Uptake time 60 minutes. Glucose level 105 mg/dl. PET imaging was acquired from the base of the skull to the mid thighs. Non-contrast enhanced computed tomography was obtained for attenuation correction and anatomic localization. COMPARISON: CT chest 07/31/2024 and prior FDG PET scan. CLINICAL HISTORY: Non-small cell lung cancer (NSCLC), staging. RESTAGING FOR NSCLC RAD TX 06/01/2024. FINDINGS: HEAD AND NECK: There are bilateral small and medium sized high density nodules within the left and right parotid gland which are intensely metabolic and not changed from comparison exam. These parotid nodules are consistent with primary parotid neoplasms. No interval change. No metabolically active cervical lymphadenopathy. CHEST: Interval right upper lobe elongated mass measures 3.2 x 1.9 cm with SUV max equal 2.9, decreased from right upper lobe mass measuring 4.6 x 3.2 cm with SUV max of 16.4 on comparison FDG PET scan. There is a new hypermetabolic right hilar lymph node/paratracheal node with SUV max equal 16.4 on image 72. ABDOMEN AND PELVIS: Within the lateral segment of the left hepatic lobe there is a new hypermetabolic lesion with SUV max equal 9.8 on image 114. There is a corresponding low density lesion on noncontrast CT measuring 2.4 cm. Scattered physiologic activity throughout the colon. Physiologic activity within the genitourinary systems. No metabolically active lymphadenopathy. BONES AND SOFT TISSUE: No skeletal metastases. No  metabolically active aggressive osseous lesion. IMPRESSION: 1. Interval decrease in size and metabolic activity of the right upper lobe mass, now measuring  3.2 x 1.9 cm, previously 4.6 x 3.2 cm. No significant metabolic activity. 2. New hypermetabolic right hilar lymph node considering metastatic lymph node. 3. New hypermetabolic lesion in the lateral segment of the left hepatic lobe consistent with lung cancer metastasis. 4. Bilateral nodules within the left and right parotid glands, intensely metabolic and unchanged from comparison exam, consistent with primary parotid neoplasms. Electronically signed by: Norleen Boxer MD 09/18/2024 10:54 AM EST RP Workstation: [id number]   MR LIVER W WO CONTRAST Result Date: 09/04/2024 CLINICAL DATA:  Liver lesion, > 1cm. EXAM: MRI ABDOMEN WITHOUT AND WITH CONTRAST TECHNIQUE: Multiplanar multisequence MR imaging of the abdomen was performed both before and after the administration of intravenous contrast. CONTRAST:  6mL GADAVIST  GADOBUTROL  1 MMOL/ML IV SOLN COMPARISON:  CT scan chest from 08/20/2024. FINDINGS: The technologist noted patient was hard of hearing and had trouble following to breathing instructions. Best obtainable images. Lower chest: Unremarkable MR appearance to the lung bases. No pleural effusion. No pericardial effusion. Normal heart size. Hepatobiliary: The liver is normal in size. Noncirrhotic configuration. Examination is moderately limited due to significant motion during data acquisition. However, there are at least 3, mildly T2 hyperintense lesions in the liver with largest in the subcapsular left hepatic lobe, segment 3 measuring 1.8 x 2.7 cm. The second largest lesion is in the right hepatic dome, segment 8, which corresponds to the lesion seen on the recent chest CT scan. These lesions exhibit target like enhancement pattern on the postcontrast images and are compatible with metastases. No intrahepatic or extrahepatic bile duct dilatation. No  choledocholithiasis. Unremarkable gallbladder. Pancreas: No mass, inflammatory changes or other parenchymal abnormality identified. No main pancreatic duct dilation. Spleen:  Within normal limits in size and appearance. No focal mass. Adrenals/Urinary Tract: Unremarkable adrenal glands. No hydroureteronephrosis. No suspicious renal mass. Stomach/Bowel: Visualized portions within the abdomen are unremarkable. No disproportionate dilation of bowel loops. Vascular/Lymphatic: No pathologically enlarged lymph nodes identified. No abdominal aortic aneurysm demonstrated. No ascites. Other: There is a well-circumscribed 4.8 x 8.5 cm T1 hypointense and T2 hyperintense nonenhancing structure in the subcutaneous tissue over the right posterolateral chest wall, incompletely characterized on the current exam but favored to represent an epidermal inclusion cyst. Correlate clinically. Musculoskeletal: No suspicious bone lesions identified. IMPRESSION: 1. There are at least 3, liver lesions, as described above, compatible with metastases. 2. Multiple other nonacute observations, as described above. Electronically Signed   By: Ree Molt M.D.   On: 09/04/2024 13:20    Labs:  CBC: Recent Labs    04/09/24 1431 04/26/24 0946 08/20/24 1252 09/21/24 1115  WBC 9.1 6.1 5.8 6.9  HGB 10.8* 11.0* 10.5* 11.0*  HCT 33.0* 33.8* 32.3* 34.2*  PLT 267 244 231 243    COAGS: Recent Labs    09/21/24 1115  INR 1.0    BMP: Recent Labs    03/29/24 0742 04/09/24 1431 04/26/24 0946 08/20/24 1252  NA 138 138 143 139  K 4.2 4.6 4.2 4.9  CL 109 107 111 105  CO2 18* 25 26 23   GLUCOSE 122* 93 96 106*  BUN 19 17 15 23   CALCIUM 9.3 9.2 9.3 9.3  CREATININE 1.51* 1.39* 1.47* 1.68*  GFRNONAA 46* 51* 47* 40*    LIVER FUNCTION TESTS: Recent Labs    04/09/24 1431 04/26/24 0946 08/20/24 1252  BILITOT 0.3 0.3 0.3  AST 16 19 21   ALT 18 18 16   ALKPHOS 85 80 82  PROT 7.2 7.2 7.3  ALBUMIN 3.9 3.9 4.1  TUMOR  MARKERS: No results for input(s): AFPTM, CEA, CA199, CHROMGRNA in the last 8760 hours.  Assessment and Plan: Patient with past medical history of lung cancer presents with concern for metastatic disease, new liver lesions.  IR consulted for biopsy at the request of Dr. Sherrod. Case reviewed by Dr. Jenna who approves patient for procedure.  Patient presents today in their usual state of health.  He has been NPO and is not currently on blood thinners.   Risks and benefits of biopsy was discussed with the patient and/or patient's family including, but not limited to bleeding, infection, damage to adjacent structures or low yield requiring additional tests.  All of the questions were answered and there is agreement to proceed.  Consent signed and in chart.    Thank you for this interesting consult.  I greatly enjoyed meeting Eric Ochoa and look forward to participating in their care.  A copy of this report was sent to the requesting provider on this date.  Electronically Signed: Pedram Goodchild Sue-Ellen Tashina Credit, PA 09/21/2024, 12:25 PM   I spent a total of  30 Minutes   in face to face in clinical consultation, greater than 50% of which was counseling/coordinating care for liver lesion.   "

## 2024-09-21 NOTE — Discharge Instructions (Addendum)
 For questions /concerns may call Interventional Radiology at [phone] or  Interventional Radiology clinic [phone]   You may remove your dressing and shower tomorrow afternoon                                                                       Liver Biopsy, Care After After a liver biopsy, it is common to have these things in the area where the biopsy was done. You may: Have pain. Feel sore. Have bruising. You may also feel tired for a few days. Follow these instructions at home: Medicines Take over-the-counter and prescription medicines only as told by your doctor. If you were prescribed an antibiotic medicine, take it as told by your doctor. Do not stop taking the antibiotic, even if you start to feel better. Do not take medicines that may thin your blood. These medicines include aspirin and ibuprofen. Take them only if your doctor tells you to. If told, take steps to prevent problems with pooping (constipation). You may need to: Drink enough fluid to keep your pee (urine) pale yellow. Take medicines. You will be told what medicines to take. Eat foods that are high in fiber. These include beans, whole grains, and fresh fruits and vegetables. Limit foods that are high in fat and sugar. These include fried or sweet foods. Ask your doctor if you should avoid driving or using machines while you are taking your medicine. Caring for your incision Follow instructions from your doctor about how to take care of your cut from surgery (incisions). Make sure you: Wash your hands with soap and water for at least 20 seconds before and after you change your bandage. If you cannot use soap and water, use hand sanitizer. Change your bandage. Leavestitches or skin glue in place for at least two weeks. Leave tape strips alone unless you are told to take them off. You may trim the edges of the tape strips if they curl up. Check your incision every day for signs of infection. Check for: Redness,  swelling, or more pain. Fluid or blood. Warmth. Pus or a bad smell. Do not take baths, swim, or use a hot tub. Ask your doctor about taking showers or sponge baths. Activity Rest at home for 1-2 days, or as told by your doctor. Get up to take short walks every 1 to 2 hours. Ask for help if you feel weak or unsteady. Do not lift anything that is heavier than 10 lb (4.5 kg), or the limit that you are told. Do not play contact sports for 2 weeks after the procedure. Return to your normal activities as told by your doctor. Ask what activities are safe for you. General instructions  Do not drink alcohol in the first week after the procedure. Plan to have a responsible adult care for you for the time you are told after you leave the hospital or clinic. This is important. It is up to you to get the results of your procedure. Ask how to get your results when they are ready. Keep all follow-up visits. Contact a doctor if: You have more bleeding in your incision. Your incision swells, or is red and more painful. You have fluid that comes from your incision. You develop a rash.  You have fever or chills. Get help right away if: You have swelling, bloating, or pain in your belly (abdomen). You get dizzy or faint. You vomit or you feel like vomiting. You have trouble breathing or feel short of breath. You have chest pain. You have problems talking or seeing. You have trouble with your balance or moving your arms or legs. These symptoms may be an emergency. Get help right away. Call your local emergency services (911 in the [place] Do not wait to see if the symptoms will go away. Do not drive yourself to the hospital. Summary After the procedure, it is common to have pain, soreness, bruising, and tiredness. Your doctor will tell you how to take care of yourself at home. Change your bandage, take your medicines, and limit your activities as told by your doctor. Call your doctor if you have  symptoms of infection. Get help right away if your belly swells, your cut bleeds a lot, or you have trouble talking or breathing. This information is not intended to replace advice given to you by your health care provider. Make sure you discuss any questions you have with your health care provider. Document Revised: 06/30/2020 Document Reviewed: 06/30/2020 Elsevier Patient Education  2024 [employer]                                                               Moderate Conscious Sedation, Adult, Care After After the procedure, it is common to have: Sleepiness for a few hours. Impaired judgment for a few hours. Trouble with balance. Nausea or vomiting if you eat too soon. Follow these instructions at home: For the time period you were told by your health care provider:  Rest. Do not participate in activities where you could fall or become injured. Do not drive or use machinery. Do not drink alcohol. Do not take sleeping pills or medicines that cause drowsiness. Do not make important decisions or sign legal documents. Do not take care of children on your own. Eating and drinking Follow instructions from your health care provider about what you may eat and drink. Drink enough fluid to keep your urine pale yellow. If you vomit: Drink clear fluids slowly and in small amounts as you are able. Clear fluids include water, ice chips, low-calorie sports drinks, and fruit juice that has water added to it (diluted fruit juice). Eat light and bland foods in small amounts as you are able. These foods include bananas, applesauce, rice, lean meats, toast, and crackers. General instructions Take over-the-counter and prescription medicines only as told by your health care provider. Have a responsible adult stay with you for the time you are told. Do not use any products that contain nicotine or tobacco. These products include cigarettes, chewing tobacco, and vaping devices, such as e-cigarettes. If  you need help quitting, ask your health care provider. Return to your normal activities as told by your health care provider. Ask your health care provider what activities are safe for you. Your health care provider may give you more instructions. Make sure you know what you can and cannot do. Contact a health care provider if: You are still sleepy or having trouble with balance after 24 hours. You feel light-headed. You vomit every time you eat or drink. You get  a rash. You have a fever. You have redness or swelling around the IV site. Get help right away if: You have trouble breathing. You start to feel confused at home. These symptoms may be an emergency. Get help right away. Call 911. Do not wait to see if the symptoms will go away. Do not drive yourself to the hospital. This information is not intended to replace advice given to you by your health care provider. Make sure you discuss any questions you have with your health care provider. Document Revised: 03/01/2022 Document Reviewed: 03/01/2022 Elsevier Patient Education  2024 [employer].

## 2024-09-21 NOTE — Procedures (Signed)
 Interventional Radiology Procedure Note  Procedure: US  Guided Biopsy of left lobe liver lesion  Complications: None  Estimated Blood Loss: < 10 mL  Findings: 18 G core biopsy of 3.7 cm left lobe liver mass performed under US  guidance.  Three core samples obtained and sent to Pathology.  Marcey DASEN. Luverne, M.D Pager:  [phone]

## 2024-10-01 ENCOUNTER — Inpatient Hospital Stay: Admitting: Internal Medicine

## 2024-10-01 ENCOUNTER — Inpatient Hospital Stay

## 2024-10-02 ENCOUNTER — Inpatient Hospital Stay: Attending: Internal Medicine | Admitting: Internal Medicine

## 2024-10-02 ENCOUNTER — Inpatient Hospital Stay: Attending: Internal Medicine

## 2024-10-02 DIAGNOSIS — C3411 Malignant neoplasm of upper lobe, right bronchus or lung: Secondary | ICD-10-CM | POA: Diagnosis not present

## 2024-10-02 DIAGNOSIS — C3490 Malignant neoplasm of unspecified part of unspecified bronchus or lung: Secondary | ICD-10-CM

## 2024-10-02 LAB — CBC WITH DIFFERENTIAL (CANCER CENTER ONLY)
Abs Immature Granulocytes: 0.01 10*3/uL (ref 0.00–0.07)
Basophils Absolute: 0.1 10*3/uL (ref 0.0–0.1)
Basophils Relative: 1 %
Eosinophils Absolute: 0.2 10*3/uL (ref 0.0–0.5)
Eosinophils Relative: 3 %
HCT: 35 % — ABNORMAL LOW (ref 39.0–52.0)
Hemoglobin: 11.4 g/dL — ABNORMAL LOW (ref 13.0–17.0)
Immature Granulocytes: 0 %
Lymphocytes Relative: 20 %
Lymphs Abs: 1.4 10*3/uL (ref 0.7–4.0)
MCH: 27.4 pg (ref 26.0–34.0)
MCHC: 32.6 g/dL (ref 30.0–36.0)
MCV: 84.1 fL (ref 80.0–100.0)
Monocytes Absolute: 0.6 10*3/uL (ref 0.1–1.0)
Monocytes Relative: 9 %
Neutro Abs: 4.7 10*3/uL (ref 1.7–7.7)
Neutrophils Relative %: 67 %
Platelet Count: 261 10*3/uL (ref 150–400)
RBC: 4.16 MIL/uL — ABNORMAL LOW (ref 4.22–5.81)
RDW: 16.1 % — ABNORMAL HIGH (ref 11.5–15.5)
WBC Count: 7 10*3/uL (ref 4.0–10.5)
nRBC: 0 % (ref 0.0–0.2)

## 2024-10-02 LAB — CMP (CANCER CENTER ONLY)
ALT: 24 U/L (ref 0–44)
AST: 25 U/L (ref 15–41)
Albumin: 4.4 g/dL (ref 3.5–5.0)
Alkaline Phosphatase: 102 U/L (ref 38–126)
Anion gap: 14 (ref 5–15)
BUN: 28 mg/dL — ABNORMAL HIGH (ref 8–23)
CO2: 21 mmol/L — ABNORMAL LOW (ref 22–32)
Calcium: 9.9 mg/dL (ref 8.9–10.3)
Chloride: 106 mmol/L (ref 98–111)
Creatinine: 1.7 mg/dL — ABNORMAL HIGH (ref 0.61–1.24)
GFR, Estimated: 40 mL/min — ABNORMAL LOW
Glucose, Bld: 106 mg/dL — ABNORMAL HIGH (ref 70–99)
Potassium: 5.1 mmol/L (ref 3.5–5.1)
Sodium: 141 mmol/L (ref 135–145)
Total Bilirubin: 0.2 mg/dL (ref 0.0–1.2)
Total Protein: 8.3 g/dL — ABNORMAL HIGH (ref 6.5–8.1)

## 2024-10-02 LAB — SURGICAL PATHOLOGY

## 2024-10-02 NOTE — Addendum Note (Signed)
 Addended by: SHERROD SHERROD on: 10/02/2024 02:54 PM   Modules accepted: Orders

## 2024-10-02 NOTE — Progress Notes (Signed)
 NN met with pt today at his follow up appt after his liver biopsy on 1/23. Pt was accompanied by his dtr, Ellouise. The results of pt's liver biopsy are not available at this time. Pt has a 2nd opinion appt at [facility] tomorrow 2/4. Pt and Ellouise encouraged to keep that appt despite the results of his liver biopsy not yet available.  NN escorted pt to scheduling to arrange for chemo education appt.

## 2024-10-04 ENCOUNTER — Telehealth: Payer: Self-pay

## 2024-10-04 NOTE — Telephone Encounter (Signed)
 Spoke with patients daughter, Cassius, regarding transfer of care. Daughter stated patient will be transferring care to [facility] under Dr. Lycan. All current appointments will be canceled. Daughter was instructed to contact the office with any needs or concerns. She voiced understanding.

## 2024-10-11 ENCOUNTER — Inpatient Hospital Stay

## 2024-10-11 ENCOUNTER — Inpatient Hospital Stay: Admitting: Physician Assistant

## 2024-12-13 ENCOUNTER — Ambulatory Visit: Admitting: Podiatry

## 2024-12-18 ENCOUNTER — Inpatient Hospital Stay

## 2024-12-25 ENCOUNTER — Inpatient Hospital Stay: Admitting: Internal Medicine
